# Patient Record
Sex: Male | Born: 1950 | Race: White | Hispanic: No | Marital: Single | Smoking: Current every day smoker
Health system: Southern US, Community
[De-identification: ages and names within clinical notes are randomized; demographics above are authoritative.]

## PROBLEM LIST (undated history)

## (undated) DIAGNOSIS — F0390 Unspecified dementia without behavioral disturbance: Secondary | ICD-10-CM

## (undated) DIAGNOSIS — I10 Essential (primary) hypertension: Secondary | ICD-10-CM

## (undated) DIAGNOSIS — I639 Cerebral infarction, unspecified: Secondary | ICD-10-CM

---

## 2008-04-08 ENCOUNTER — Ambulatory Visit: Payer: Self-pay | Admitting: Cardiology

## 2008-04-08 ENCOUNTER — Inpatient Hospital Stay (HOSPITAL_COMMUNITY): Admission: EM | Admit: 2008-04-08 | Discharge: 2008-04-10 | Payer: Self-pay | Admitting: Emergency Medicine

## 2008-04-09 ENCOUNTER — Ambulatory Visit: Payer: Self-pay | Admitting: Physical Medicine & Rehabilitation

## 2008-04-09 ENCOUNTER — Encounter (INDEPENDENT_AMBULATORY_CARE_PROVIDER_SITE_OTHER): Payer: Self-pay | Admitting: *Deleted

## 2008-04-09 ENCOUNTER — Ambulatory Visit: Payer: Self-pay | Admitting: Vascular Surgery

## 2011-03-07 NOTE — H&P (Signed)
NAME:  Mark [name], Mark [name] NO.:  [id number]   MEDICAL RECORD NO.:  [id number]          PATIENT TYPE:  EMS   LOCATION:  MAJO                         FACILITY:  [facility]   PHYSICIAN:  Michaelyn Barter, M.D. DATE OF BIRTH:  08/01/1951   DATE OF ADMISSION:  04/08/2008  DATE OF DISCHARGE:                              HISTORY & PHYSICAL   PRIMARY MEDICAL DOCTOR:  Unassigned.   CHIEF COMPLAINT:  Bilateral leg weakness.   HISTORY OF PRESENT ILLNESS:  Mr. Mark [name] is a 60 year old gentleman who  states that this morning at approximately 6 o'clock a.m. he experienced  weakness in both of his legs.  He is very vague with regards to giving  detail and is somewhat of a poor historian.  He indicates that his  roommate Mark [name] noticed that his speech appeared to be somewhat slurred and  also that he had some weakness in his right hand.  He indicated that it  was more difficult for him to hold a cup of coffee this morning than  typical.  He denies having any nausea, vomiting, fevers or chills.  No  chest pain or shortness of breath.   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   PAST SURGICAL HISTORY:  None.   ALLERGIES:  No known drug allergies.   SOCIAL HISTORY:  Cigarettes:  The patient smokes 1 pack of cigarettes  per day.  He  started at the age of 69.  Alcohol, beer:  The patient  drinks 6 to 10 cans of beer every day.  He has been doing so since the  age of 31.  He is divorced.  He has 1 daughter.   FAMILY HISTORY:  Mother had a history of Alzheimer's dementia.  Father's  medical history is unknown.   REVIEW OF SYSTEMS:  As per HPI; otherwise, all other systems are  negative.   PHYSICAL EXAMINATION:  GENERAL:  The patient is awake.  He is  cooperative.  He is in no obvious respiratory distress.  VITAL SIGNS:  Temperature 97.4, blood pressure 233/132, heart rate 110,  respirations 18, O2 sat 98%.  HEENT:  Normocephalic, atraumatic.  Anicteric.   Extraocular movements  are intact.  Pupils are equally reactive to light.  Oral mucosa is pink.  No thrush or exudates.  All of the patient's teeth are missing.  NECK:  Supple.  No JVD, no lymphadenopathy, no thyromegaly.  CARDIAC:  S1 and S2 present.  Regular rate and rhythm.  RESPIRATORY:  No crackles or wheezes.  ABDOMEN:  Flat, soft, nontender, nondistended.  No masses palpated.  Positive bowel sounds x4 quadrants.  EXTREMITIES:  No leg edema.  NEUROLOGIC:  The patient is alert and oriented x3.  MUSCULOSKELETAL:  Approximately 5/5 right arm, 4.5/5 left arm.  The left  arm has a significant decreased range of motion in comparison to the  right arm.  Bilateral leg strength is approximately 5/5.  Neurologically, the patient has a left-sided facial droop.  His grip  strength is greater on the  right than on the left.  Likewise, he has  somewhat of a pronator drift  on the left, and again he had significant  decreased range of motion on the left in comparison to the right.   A CT scan of the patient's head was completed.  It reveals extensive age-  advanced atrophy, chronic ischemic white matter disease, and innumerable  scattered lacunar infarcts. remote bilateral basal ganglia and deep  white matter infarcts, encephalomalacia from old right parietal infarct.  A chest x-ray reveals an ill-defined density at the left base medially.  No other significant findings were noted.  An EKG reveals normal sinus  rhythm with left ventricular hypertrophy.   ASSESSMENT/PLAN:  1. Bilateral leg and right arm weakness.  The patient's symptoms are      consistent with that of a cerebrovascular accident, the acuity of      which is questionable.  He is a very poor historian, indicating      that he did not notice some of his symptoms but in fact they were      brought to his attention by his roommate.  He indicated that he did      not at first notice that he was having any problem until 6 o'clock       this morning, and now it is going on 7 o'clock p.m.; therefore, if      he were a candidate for thrombolytic therapy, he is now out of the      window for that.  We will order an MRI, MRA of the patient's brain.      We will order a 2-D echo, carotid Dopplers, fasting lipid profile,      TSH, T4, and also consult neurology as well as physical therapy and      occupational therapy.  2. Severe uncontrolled hypertension.  We will start the patient on      antihypertensive medication.  3. History of alcohol abuse.  Currently he demonstrates no obvious      signs of withdrawal.  We will start him on thiamine, folic acid,      multivitamin and reserve Ativan for p.r.n. medication.  4. GI prophylaxis.  We will provide Protonix.  5. DVT prophylaxis with Lovenox.      Michaelyn Barter, M.D.  Electronically Signed     OR/MEDQ  D:  04/08/2008  T:  04/08/2008  Job:  [id number]

## 2011-03-07 NOTE — Discharge Summary (Signed)
NAME:  Mark [name], Mark [name]            ACCOUNT NO.:  [id number]   MEDICAL RECORD NO.:  [id number]          PATIENT TYPE:  INP   LOCATION:  3016                         FACILITY:  [facility]   PHYSICIAN:  Michaelyn Barter, M.D. DATE OF BIRTH:  [birth year]   DATE OF ADMISSION:  04/08/2008  DATE OF DISCHARGE:  04/10/2008                               DISCHARGE SUMMARY   FINAL DIAGNOSIS:  1. Acute cerebrovascular accident.  2. Hypokalemia.  3. Uncontrolled hypertension.  4. Alcohol abuse.   PROCEDURES:  1. CT scan of the head without contrast April 08, 2008.  2. Portable chest x-ray April 08, 2008.  3. MRI/MRA of the brain April 09, 2008.  4. MRA of the neck with and without contrast April 09, 2008.   CONSULTATIONS:  Neurology with Dr. Pearlean Brownie.   HISTORY OF PRESENT ILLNESS:  Mark [name] is a 60 year old gentleman who  indicated that, at approximately 6 a.m. on the morning of admission he  experienced weakness in both of his legs.  He was very vague with  regards to giving details, further regarding his weakness.  He indicated  that his roommate Mark [name] noticed that his speech also appeared to be  slurred, and he stated that his right hand had become a bit more weak  than typical.   PAST MEDICAL HISTORY:  Please see that dictated by Dr. Michaelyn Barter   HOSPITAL COURSE:  1. Acute CVA.  A CT scan of the patient's head without contrast was      done on June 17th.  It revealed extensive age advanced atrophy,      chronic ischemic white matter disease, and innumerable scattered      lacunar infarcts.  A remote bilateral basal ganglia and deep white      matter infarct was also noted.  Encephalomalacia from old right      parietal infarcts were also seen.  On June 18 the patient had a MRI      completed which revealed an acute 1 cm infarction in the right      corona radiata involving the superior/posterior limb of the      internal capsule.  His MRA revealed no major vessel occlusion,      diffuse  intracranial atherosclerotic change in the medium-to-small      vessels most notable in the MCA and PCA branches, mild stenosis of      the A1 and M1 segments on the left was also noted.   Neurology was consulted, Dr. Anne Hahn saw the patient.  His impression was  that the patient had a new onset stroke in the right centrum semi ovale  as well as severe small vessel disease.  Aspirin therapy was initiated.  Physical therapy was consulted as well as occupational therapy.  A 2-D  echocardiogram was completed on June 18.  Overall left ventricular  systolic function was normal.  Left ventricular EF was estimated to  range between 60%-65%.  No left ventricular regional wall motion  abnormalities were noted.  No obvious emboli was documented.  Carotid  duplex was completed on June 18th.  No significant plaque was  visualized.  No ICA stenosis was noted.  Rehab was consulted, and on  June 19th they documented that the patient will not need acute inpatient  rehab.  On June 19th occupational therapy saw the patient, and they  stated the patient was approaching baseline; and he stated that he felt  almost like his regular self.  Over the course of his hospitalization  his strength did begin to return, and he was able to ambulate around the  nurse's station with little assistance.   1. Severely uncontrolled hypertension.  The patient's blood pressure      was 233/132 at the time of admission.  He was started on a      combination of Norvasc and metoprolol.  During this hospital course      his blood pressure improved, although it did not completely      normalize.  He was told to followup with primary care physician for      further adjustments in his medications.   1. Hypokalemia.  Potassium supplementation was provided to the      patient.  2. History of alcohol abuse.  Thiamine, folic acid, and multivitamin      were provided.  The patient did not demonstrate any obvious active      signs of  withdrawal during his hospital course.  In addition, the      patient also had an MRA of his neck with and without contrast      completed on June 18th.  It revealed no significant carotid artery      disease, occluded right vertebral artery disease.  The distal right      vertebral artery did fill with contrast to the level of the PICA.   On the date of discharge the patient stated that he felt better and  stronger, and he requested to be discharged.  Vital Signs: Temperature  was 97.7, heart rate 67, respirations 18, blood pressure 169/88, O2 97  on room air.  His sodium was 137, potassium 4.0, chloride 106, CO2 24,  BUN 9, creatinine 0.64, glucose 109, calcium 9.1. The patient was  discharged from the hospital to home.   MEDICATIONS:  1. Norvasc 10 mg p.o. daily.  2. Metoprolol 25 mg p.o. b.i.d.  3. Ecotrin 325 mg p.o. daily.  4. Folic acid 1 mg daily.  5. Thiamine 1 mg p.o. daily.   He was told to take all of medications as prescribed, to stop drinking  alcohol, and to followup with Dr. Pearlean Brownie within 2 months.  He was given  Dr. Marlis Edelson number [phone] to call for an appointment.      Michaelyn Barter, M.D.  Electronically Signed     OR/MEDQ  D:  04/10/2008  T:  04/10/2008  Job:  [id number]

## 2011-03-07 NOTE — Consult Note (Signed)
NAME:  Mark [name], Mark [name]            ACCOUNT NO.:  [id number]   MEDICAL RECORD NO.:  [id number]          PATIENT TYPE:  INP   LOCATION:  3016                         FACILITY:  [facility]   PHYSICIAN:  Marlan Palau, M.D.  DATE OF BIRTH:  11/20/1950   DATE OF CONSULTATION:  04/08/2008  DATE OF DISCHARGE:                                 CONSULTATION   HISTORY OF PRESENT ILLNESS:  Mark [name] is a 60 year old right-  handed white male born Sep 30, 1951, with a history of tobacco  abuse, and alcohol over use.  This patient comes to [facility]  after awakened on April 08, 2008, with the difficulty with walking noted  that he was weak on the left arm and left leg.  The patient would tend  to fall.  She tried to walk down stairs.  The patient was brought in for  further evaluation.  An MRI scan has shown an acute right centrum  semiovale infarct.  The patient has very extensive small-vessel ischemic  changes, cerebral atrophy, right posterior parietal temporal infarct  noted that is chronic by MRI.  Intracranial MRI angiogram has been done  is relatively unremarkable and carotid Doppler study has been done which  is unremarkable.  Neurology is asked to see the patient for further  evaluation.  The patient was not being treated for blood pressure  problems and was not on aspirin prior to admission.   PAST MEDICAL HISTORY:  Significant for.  1. New onset of right centrum semiovale infarct.  2. Severe cerebrovascular disease by MRI.  3. Hypertension that was untreated.  4. Tobacco abuse.  5. 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   The patient had chronic diarrhea prior to treatment.  The patient smokes  a pack of cigarettes daily.  Drinks a 6-pack of beer daily, has no known  allergies, was not on any medications prior to coming in the hospital.   CURRENT MEDICATIONS:  Include.  1. Norvasc 5 mg daily.  2. Aspirin 325 mg daily.  3.  Lovenox 40 mg subcu daily.  4. Folic acid 1 mg daily.  5. Lopressor 25 mg b.i.d.  6. Multivitamins 1 a day.  7. Protonix 40 mg daily.  8. Potassium 40 mEq 1 daily.  9. Thiamine 100 mg daily.  10.Tylenol if needed   SOCIAL HISTORY:  The patient is single, lives in the [place] area, is recently been laid off, lives with a friend, has  1 daughter who is alive and well.   FAMILY MEDICAL HISTORY:  Mother died with intracranial hemorrhage.  Father had alcoholism.  Cause of death unknown.  The patient has 1  sister who is alive and well.  There is history of senile dementia  Alzheimer's type in the family in the mother.   REVIEW OF SYSTEMS:  Notable for no recent fevers, chills.  The patient  denies headache.  Denies visual field changes, swallowing problems,  speech changes, neck pain, shortness of breath.  Denies chest pains,  abdominal pains, nausea, vomiting, troubles controlling  bowels or  bladder, denies any previous numbness or weakness on the face, arms or  legs, headache, blackout episodes.   PHYSICAL EXAMINATION:  VITALS:  Blood pressure is 159/86, heart rate 77,  respiratory rate 18, temperature afebrile.  In general, this patient is  a fairly well-developed white male who is alert, cooperative at the time  examination.  HEENT EXAMINATION:  Head is atraumatic.  Eyes, pupils are equal, round  and to light.  Disks are flat bilaterally.  NECK:  Supple.  No carotid bruits noted.  RESPIRATORY EXAMINATION:  Clear.  CARDIOVASCULAR EXAMINATION:  Reveals a regular rate and rhythm.  No  obvious murmurs, rubs noted.  EXTREMITIES:  Without significant edema.  NEUROLOGIC EXAMINATION:  Cranial nerves as above.  Facial symmetry is  present.  The patient has good sensation of the face to pinprick, soft  touch bilaterally, has good strength, facial muscles and muscles head  turn shrug bilaterally.  Speech is well enunciated not aphasic.  Motor  testing reveals some  slight weakness of grip strength on the left arm,  otherwise to direct testing fairly good strength on both arms and both  legs; however, there is drift in the left arm and left leg.  No drift is  seen on the right.  Ataxia however seen in the upper extremity on the  right to finger-nose-finger, mild ataxia seen in both legs with heel-to-  shin.  The patient has no evidence of extinction, has symmetric  reflexes.  Toes are neutral bilaterally.  Again pinprick, soft touch,  sensation in the arms or legs is symmetric and normal.   LABORATORY VALUES:  Notable for white count of 11.5, hemoglobin 13.9,  hematocrit 39.5, MCV of 94.4, platelets of 312, INR of 0.9, sodium 134,  potassium 3.4, chloride of 102, CO2 of 23, glucose of 110, BUN of 7,  creatinine 0.63, total bili 1.1, alk phosphatase 45, SGOT 21, SGPT of  12, total protein 6.9, albumin of 4.0, calcium 9.2, CK-MB of 1.6,  troponin-I of less than 0.05, total cholesterol 171, triglycerides of  93, HDL 48, LDL 104, VLDL of 19.  Urine drug screen is negative with  exception of THC which is positive.  Urinalysis reveals specific gravity  of 1.009, pH 7.5, ketones of 15 mg/dL, protein 30 mg/dL, 0-2 white  cells.   MRI scan of the brain is as above.   Chest x-ray shows ill-defined density of left base.  No other acute  findings were noted.   IMPRESSION:  1. New onset of stroke in the right centrum semiovale.  2. Severe small vessel disease by the MRI associated with cortical      atrophy.  3. Hypertension.  4. Tobacco abuse and alcohol abuse.   This patient has NIH stroke scale score of 4, is not a t-PA candidate  due to duration of symptoms at the time of presentation.  The patient  has come in without prior medical follow-up, has likely had untreated  hypertension was not on antiplatelet agents prior to coming in, smoking  a pack of cigarettes daily.  The patient will complete further stroke  workup at this point.   PLAN:  1. MRI  angiogram of the extracranial vessels.  2. 2-D echocardiogram has been ordered, but is pending.  3. Blood pressure control.  4. Agree with physical and occupational therapy evaluation and rehab      evaluation.  5. Agree with aspirin treatment.  We will follow the patient's  clinical course while in-house.      Marlan Palau, M.D.  Electronically Signed     CKW/MEDQ  D:  04/09/2008  T:  04/10/2008  Job:  [id number]   cc:   [facility]

## 2011-07-20 LAB — T4: T4, Total: 6.8

## 2011-07-20 LAB — COMPREHENSIVE METABOLIC PANEL
Alkaline Phosphatase: 45
BUN: 7
CO2: 23
Chloride: 102
Creatinine, Ser: 0.63
GFR calc non Af Amer: >60
Potassium: 3.4 — ABNORMAL LOW
Total Bilirubin: 1.1

## 2011-07-20 LAB — URINALYSIS, ROUTINE W REFLEX MICROSCOPIC
Bilirubin Urine: NEGATIVE
Hgb urine dipstick: NEGATIVE
Ketones, ur: 15 — AB
Nitrite: NEGATIVE
pH: 7.5

## 2011-07-20 LAB — CBC
HCT: 39.5
HCT: 46.7
Hemoglobin: 13.9
MCV: 94.4
Platelets: 376
RBC: 4.19 — ABNORMAL LOW
RDW: 12.7
WBC: 11.5 — ABNORMAL HIGH

## 2011-07-20 LAB — HEPATIC FUNCTION PANEL
ALT: 16
AST: 22
Albumin: 4.6
Alkaline Phosphatase: 53
Total Bilirubin: 0.9

## 2011-07-20 LAB — BASIC METABOLIC PANEL
BUN: 4 — ABNORMAL LOW
CO2: 24
Calcium: 9.1
Calcium: 9.6
Chloride: 106
Creatinine, Ser: 0.64
GFR calc Af Amer: >60
GFR calc non Af Amer: >60
Glucose, Bld: 104 — ABNORMAL HIGH
Sodium: 137

## 2011-07-20 LAB — URINE MICROSCOPIC-ADD ON

## 2011-07-20 LAB — RAPID URINE DRUG SCREEN, HOSP PERFORMED
Amphetamines: NOT DETECTED
Barbiturates: NOT DETECTED
Opiates: NOT DETECTED

## 2011-07-20 LAB — LIPID PANEL
HDL: 48
VLDL: 19

## 2011-07-20 LAB — TSH: TSH: 1.458

## 2011-07-20 LAB — POCT CARDIAC MARKERS: Troponin i, poc: <0.05

## 2011-07-20 LAB — APTT: aPTT: 24

## 2014-09-08 ENCOUNTER — Inpatient Hospital Stay (HOSPITAL_COMMUNITY)
Admission: EM | Admit: 2014-09-08 | Discharge: 2014-09-21 | DRG: 350 | Disposition: A | Discharge status: Hospice | Payer: Medicare Other | Attending: Internal Medicine | Admitting: Internal Medicine

## 2014-09-08 ENCOUNTER — Emergency Department (HOSPITAL_COMMUNITY): Payer: Medicare Other

## 2014-09-08 ENCOUNTER — Encounter (HOSPITAL_COMMUNITY): Payer: Self-pay

## 2014-09-08 DIAGNOSIS — I611 Nontraumatic intracerebral hemorrhage in hemisphere, cortical: Secondary | ICD-10-CM | POA: Diagnosis not present

## 2014-09-08 DIAGNOSIS — W06XXXA Fall from bed, initial encounter: Secondary | ICD-10-CM | POA: Diagnosis not present

## 2014-09-08 DIAGNOSIS — Z79899 Other long term (current) drug therapy: Secondary | ICD-10-CM | POA: Diagnosis not present

## 2014-09-08 DIAGNOSIS — E871 Hypo-osmolality and hyponatremia: Secondary | ICD-10-CM | POA: Diagnosis present

## 2014-09-08 DIAGNOSIS — F1020 Alcohol dependence, uncomplicated: Secondary | ICD-10-CM | POA: Diagnosis present

## 2014-09-08 DIAGNOSIS — R532 Functional quadriplegia: Secondary | ICD-10-CM | POA: Diagnosis present

## 2014-09-08 DIAGNOSIS — G9340 Encephalopathy, unspecified: Secondary | ICD-10-CM | POA: Diagnosis present

## 2014-09-08 DIAGNOSIS — I619 Nontraumatic intracerebral hemorrhage, unspecified: Secondary | ICD-10-CM

## 2014-09-08 DIAGNOSIS — Z8673 Personal history of transient ischemic attack (TIA), and cerebral infarction without residual deficits: Secondary | ICD-10-CM

## 2014-09-08 DIAGNOSIS — F419 Anxiety disorder, unspecified: Secondary | ICD-10-CM | POA: Diagnosis present

## 2014-09-08 DIAGNOSIS — R531 Weakness: Secondary | ICD-10-CM | POA: Diagnosis present

## 2014-09-08 DIAGNOSIS — Z9181 History of falling: Secondary | ICD-10-CM

## 2014-09-08 DIAGNOSIS — R609 Edema, unspecified: Secondary | ICD-10-CM | POA: Diagnosis present

## 2014-09-08 DIAGNOSIS — D72829 Elevated white blood cell count, unspecified: Secondary | ICD-10-CM | POA: Diagnosis present

## 2014-09-08 DIAGNOSIS — F0390 Unspecified dementia without behavioral disturbance: Secondary | ICD-10-CM | POA: Diagnosis present

## 2014-09-08 DIAGNOSIS — K4030 Unilateral inguinal hernia, with obstruction, without gangrene, not specified as recurrent: Principal | ICD-10-CM | POA: Diagnosis present

## 2014-09-08 DIAGNOSIS — S065X0A Traumatic subdural hemorrhage without loss of consciousness, initial encounter: Secondary | ICD-10-CM | POA: Diagnosis not present

## 2014-09-08 DIAGNOSIS — Z515 Encounter for palliative care: Secondary | ICD-10-CM | POA: Diagnosis not present

## 2014-09-08 DIAGNOSIS — E876 Hypokalemia: Secondary | ICD-10-CM | POA: Diagnosis not present

## 2014-09-08 DIAGNOSIS — F17210 Nicotine dependence, cigarettes, uncomplicated: Secondary | ICD-10-CM | POA: Diagnosis present

## 2014-09-08 DIAGNOSIS — I10 Essential (primary) hypertension: Secondary | ICD-10-CM | POA: Diagnosis present

## 2014-09-08 DIAGNOSIS — R296 Repeated falls: Secondary | ICD-10-CM | POA: Diagnosis present

## 2014-09-08 DIAGNOSIS — S0633AA Contusion and laceration of cerebrum, unspecified, with loss of consciousness status unknown, initial encounter: Secondary | ICD-10-CM | POA: Insufficient documentation

## 2014-09-08 DIAGNOSIS — R627 Adult failure to thrive: Secondary | ICD-10-CM | POA: Diagnosis present

## 2014-09-08 DIAGNOSIS — S06360A Traumatic hemorrhage of cerebrum, unspecified, without loss of consciousness, initial encounter: Secondary | ICD-10-CM | POA: Insufficient documentation

## 2014-09-08 DIAGNOSIS — F0150 Vascular dementia without behavioral disturbance: Secondary | ICD-10-CM | POA: Diagnosis present

## 2014-09-08 DIAGNOSIS — Z681 Body mass index (BMI) 19 or less, adult: Secondary | ICD-10-CM

## 2014-09-08 DIAGNOSIS — R509 Fever, unspecified: Secondary | ICD-10-CM

## 2014-09-08 DIAGNOSIS — E43 Unspecified severe protein-calorie malnutrition: Secondary | ICD-10-CM | POA: Diagnosis present

## 2014-09-08 DIAGNOSIS — Z7189 Other specified counseling: Secondary | ICD-10-CM | POA: Insufficient documentation

## 2014-09-08 DIAGNOSIS — N390 Urinary tract infection, site not specified: Secondary | ICD-10-CM | POA: Diagnosis not present

## 2014-09-08 DIAGNOSIS — Y92230 Patient room in hospital as the place of occurrence of the external cause: Secondary | ICD-10-CM

## 2014-09-08 DIAGNOSIS — Z66 Do not resuscitate: Secondary | ICD-10-CM | POA: Diagnosis present

## 2014-09-08 DIAGNOSIS — R451 Restlessness and agitation: Secondary | ICD-10-CM | POA: Insufficient documentation

## 2014-09-08 DIAGNOSIS — S065X9A Traumatic subdural hemorrhage with loss of consciousness of unspecified duration, initial encounter: Secondary | ICD-10-CM

## 2014-09-08 DIAGNOSIS — E869 Volume depletion, unspecified: Secondary | ICD-10-CM | POA: Diagnosis not present

## 2014-09-08 DIAGNOSIS — K4090 Unilateral inguinal hernia, without obstruction or gangrene, not specified as recurrent: Secondary | ICD-10-CM

## 2014-09-08 DIAGNOSIS — F1027 Alcohol dependence with alcohol-induced persisting dementia: Secondary | ICD-10-CM | POA: Diagnosis present

## 2014-09-08 DIAGNOSIS — S065XAA Traumatic subdural hemorrhage with loss of consciousness status unknown, initial encounter: Secondary | ICD-10-CM | POA: Diagnosis not present

## 2014-09-08 DIAGNOSIS — E222 Syndrome of inappropriate secretion of antidiuretic hormone: Secondary | ICD-10-CM | POA: Diagnosis present

## 2014-09-08 DIAGNOSIS — R0682 Tachypnea, not elsewhere classified: Secondary | ICD-10-CM

## 2014-09-08 DIAGNOSIS — I639 Cerebral infarction, unspecified: Secondary | ICD-10-CM | POA: Insufficient documentation

## 2014-09-08 DIAGNOSIS — W19XXXA Unspecified fall, initial encounter: Secondary | ICD-10-CM

## 2014-09-08 HISTORY — DX: Cerebral infarction, unspecified: I63.9

## 2014-09-08 HISTORY — DX: Essential (primary) hypertension: I10

## 2014-09-08 HISTORY — DX: Unspecified dementia, unspecified severity, without behavioral disturbance, psychotic disturbance, mood disturbance, and anxiety: F03.90

## 2014-09-08 LAB — PROTIME-INR
INR: 0.95 (ref 0.00–1.49)
Prothrombin Time: 12.8 seconds (ref 11.6–15.2)

## 2014-09-08 LAB — COMPREHENSIVE METABOLIC PANEL
ALBUMIN: 4 g/dL (ref 3.5–5.2)
ALT: 13 U/L (ref 0–53)
AST: 31 U/L (ref 0–37)
Alkaline Phosphatase: 63 U/L (ref 39–117)
Anion gap: 14 (ref 5–15)
BILIRUBIN TOTAL: 0.7 mg/dL (ref 0.3–1.2)
BUN: 6 mg/dL (ref 6–23)
CHLORIDE: 82 meq/L — AB (ref 96–112)
CO2: 21 mEq/L (ref 19–32)
CREATININE: 0.59 mg/dL (ref 0.50–1.35)
Calcium: 9.4 mg/dL (ref 8.4–10.5)
GFR calc Af Amer: >90 mL/min (ref >90)
GFR calc non Af Amer: >90 mL/min (ref >90)
Glucose, Bld: 94 mg/dL (ref 70–99)
POTASSIUM: 4.1 meq/L (ref 3.7–5.3)
Sodium: 117 mEq/L — CL (ref 137–147)
Total Protein: 7.5 g/dL (ref 6.0–8.3)

## 2014-09-08 LAB — CBC WITH DIFFERENTIAL/PLATELET
BASOS ABS: 0 10*3/uL (ref 0.0–0.1)
Basophils Relative: 0 % (ref 0–1)
EOS PCT: 0 % (ref 0–5)
Eosinophils Absolute: 0 10*3/uL (ref 0.0–0.7)
HEMATOCRIT: 38.6 % — AB (ref 39.0–52.0)
Hemoglobin: 13.9 g/dL (ref 13.0–17.0)
LYMPHS ABS: 3.2 10*3/uL (ref 0.7–4.0)
Lymphocytes Relative: 16 % (ref 12–46)
MCH: 30.5 pg (ref 26.0–34.0)
MCHC: 36 g/dL (ref 30.0–36.0)
MCV: 84.8 fL (ref 78.0–100.0)
MONOS PCT: 7 % (ref 3–12)
Monocytes Absolute: 1.4 10*3/uL — ABNORMAL HIGH (ref 0.1–1.0)
Neutro Abs: 15.2 10*3/uL — ABNORMAL HIGH (ref 1.7–7.7)
Neutrophils Relative %: 77 % (ref 43–77)
Platelets: 363 10*3/uL (ref 150–400)
RBC: 4.55 MIL/uL (ref 4.22–5.81)
RDW: 12.2 % (ref 11.5–15.5)
WBC: 19.8 10*3/uL — AB (ref 4.0–10.5)

## 2014-09-08 LAB — RAPID URINE DRUG SCREEN, HOSP PERFORMED
Amphetamines: NOT DETECTED
Barbiturates: NOT DETECTED
Benzodiazepines: NOT DETECTED
Cocaine: NOT DETECTED
Opiates: NOT DETECTED
TETRAHYDROCANNABINOL: POSITIVE — AB

## 2014-09-08 LAB — TROPONIN I

## 2014-09-08 LAB — I-STAT CG4 LACTIC ACID, ED: Lactic Acid, Venous: 1.28 mmol/L (ref 0.5–2.2)

## 2014-09-08 LAB — ETHANOL: Alcohol, Ethyl (B): <11 mg/dL (ref 0–11)

## 2014-09-08 MED ORDER — CEFAZOLIN (ANCEF) 1 G IV SOLR
2.0000 g | INTRAVENOUS | Status: AC
  Administered 2014-09-09: 2 g
  Filled 2014-09-08: qty 2

## 2014-09-08 MED ORDER — IOHEXOL 300 MG/ML  SOLN
100.0000 mL | Freq: Once | INTRAMUSCULAR | Status: AC | PRN
  Administered 2014-09-08: 100 mL via INTRAVENOUS

## 2014-09-08 MED ORDER — HYDROGEN PEROXIDE 3 % EX SOLN
CUTANEOUS | Status: AC
  Administered 2014-09-08: 1
  Filled 2014-09-08: qty 473

## 2014-09-08 MED ORDER — LORAZEPAM 2 MG/ML IJ SOLN
1.0000 mg | Freq: Once | INTRAMUSCULAR | Status: AC
  Administered 2014-09-08: 1 mg via INTRAVENOUS
  Filled 2014-09-08: qty 1

## 2014-09-08 MED ORDER — SODIUM CHLORIDE 0.9 % IV BOLUS (SEPSIS)
500.0000 mL | Freq: Once | INTRAVENOUS | Status: AC
  Administered 2014-09-08: 500 mL via INTRAVENOUS

## 2014-09-08 MED ORDER — LORAZEPAM 2 MG/ML IJ SOLN
INTRAMUSCULAR | Status: AC
  Administered 2014-09-08: 0.5 mg
  Filled 2014-09-08: qty 1

## 2014-09-08 MED ORDER — TUBERCULIN PPD 5 UNIT/0.1ML ID SOLN
5.0000 [IU] | Freq: Once | INTRADERMAL | Status: AC
  Administered 2014-09-08: 5 [IU] via INTRADERMAL
  Filled 2014-09-08: qty 0.1

## 2014-09-08 NOTE — ED Notes (Signed)
Patient family at bedside, states patient has right side weakness due to CVA in 2009 Patient noted to have residual tremors to both arms, which patient's family states in normal for patient Patient states that he does not know why he is here in ED Family states that patient has been falling more than usual

## 2014-09-08 NOTE — ED Notes (Signed)
Verbal consent from sister Darel [place], [place].

## 2014-09-08 NOTE — H&P (Signed)
Mark [name] is an 63 y.o. male.    Chief Complaint: Family reports Falls, weakness  HPI: Patient is a 63 year old male.  He reportedly was brought to the emergency room by his family due to weakness and falling. During workup he was found to have an incarcerated right inguinal hernia and I was asked to see the patient for this.  There is currently no family in the hospital from whom to obtain a history.. The patient is unable to give a history.  Past Medical History  Diagnosis Date  . Hypertension   . Stroke   . Dementia     History reviewed. No pertinent past surgical history.  History reviewed. No pertinent family history. Social History:  reports that he has been smoking.  He does not have any smokeless tobacco history on file. He reports that he drinks alcohol. He reports that he does not use illicit drugs.  Allergies: No Known Allergies   Current Facility-Administered Medications  Medication Dose Route Frequency Provider Last Rate Last Dose  . tuberculin injection 5 Units  5 Units Intradermal Once Pamella Pert, MD   5 Units at 09/08/14 2202   Current Outpatient Prescriptions  Medication Sig Dispense Refill  . lisinopril (PRINIVIL,ZESTRIL) 20 MG tablet Take 20 mg by mouth daily.    Marland Kitchen lisinopril (PRINIVIL,ZESTRIL) 10 MG tablet Take 10 mg by mouth daily.       Results for orders placed or performed during the hospital encounter of 09/08/14 (from the past 48 hour(s))  Comprehensive metabolic panel     Status: Abnormal   Collection Time: 09/08/14  8:21 PM  Result Value Ref Range   Sodium 117 (LL) 137 - 147 mEq/L    Comment: CRITICAL RESULT CALLED TO, READ BACK BY AND VERIFIED WITH: Francesca Oman RN 2114 11/147/15 A NAVARRO    Potassium 4.1 3.7 - 5.3 mEq/L   Chloride 82 (L) 96 - 112 mEq/L   CO2 21 19 - 32 mEq/L   Glucose, Bld 94 70 - 99 mg/dL   BUN 6 6 - 23 mg/dL   Creatinine, Ser 0.59 0.50 - 1.35 mg/dL   Calcium 9.4 8.4 - 10.5 mg/dL   Total Protein 7.5 6.0 - 8.3  g/dL   Albumin 4.0 3.5 - 5.2 g/dL   AST 31 0 - 37 U/L   ALT 13 0 - 53 U/L   Alkaline Phosphatase 63 39 - 117 U/L   Total Bilirubin 0.7 0.3 - 1.2 mg/dL   GFR calc non Af Amer >90 >90 mL/min   GFR calc Af Amer >90 >90 mL/min    Comment: (NOTE) The eGFR has been calculated using the CKD EPI equation. This calculation has not been validated in all clinical situations. eGFR's persistently <90 mL/min signify possible Chronic Kidney Disease.    Anion gap 14 5 - 15  CBC with Differential     Status: Abnormal   Collection Time: 09/08/14  8:21 PM  Result Value Ref Range   WBC 19.8 (H) 4.0 - 10.5 K/uL   RBC 4.55 4.22 - 5.81 MIL/uL   Hemoglobin 13.9 13.0 - 17.0 g/dL   HCT 38.6 (L) 39.0 - 52.0 %   MCV 84.8 78.0 - 100.0 fL   MCH 30.5 26.0 - 34.0 pg   MCHC 36.0 30.0 - 36.0 g/dL   RDW 12.2 11.5 - 15.5 %   Platelets 363 150 - 400 K/uL   Neutrophils Relative % 77 43 - 77 %   Lymphocytes Relative 16 12 - 46 %  Monocytes Relative 7 3 - 12 %   Eosinophils Relative 0 0 - 5 %   Basophils Relative 0 0 - 1 %   Neutro Abs 15.2 (H) 1.7 - 7.7 K/uL   Lymphs Abs 3.2 0.7 - 4.0 K/uL   Monocytes Absolute 1.4 (H) 0.1 - 1.0 K/uL   Eosinophils Absolute 0.0 0.0 - 0.7 K/uL   Basophils Absolute 0.0 0.0 - 0.1 K/uL   Smear Review MORPHOLOGY UNREMARKABLE   Ethanol     Status: Mark [name]   Collection Time: 09/08/14  8:21 PM  Result Value Ref Range   Alcohol, Ethyl (B) <11 0 - 11 mg/dL    Comment:        LOWEST DETECTABLE LIMIT FOR SERUM ALCOHOL IS 11 mg/dL FOR MEDICAL PURPOSES ONLY   Protime-INR     Status: Mark [name]   Collection Time: 09/08/14  8:21 PM  Result Value Ref Range   Prothrombin Time 12.8 11.6 - 15.2 seconds   INR 0.95 0.00 - 1.49  Troponin I     Status: Mark [name]   Collection Time: 09/08/14  8:21 PM  Result Value Ref Range   Troponin I <0.30 <0.30 ng/mL    Comment:        Due to the release kinetics of cTnI, a negative result within the first hours of the onset of symptoms does not rule out myocardial  infarction with certainty. If myocardial infarction is still suspected, repeat the test at appropriate intervals.   I-Stat CG4 Lactic Acid, ED     Status: Mark [name]   Collection Time: 09/08/14  9:08 PM  Result Value Ref Range   Lactic Acid, Venous 1.28 0.5 - 2.2 mmol/L   Dg Chest 2 View  09/08/2014   CLINICAL DATA:  WEAKNESS, confusion  EXAM: CHEST - 2 VIEW  COMPARISON:  04/08/2008  FINDINGS: Calcified lingular nodules and left hilar lymph nodes, stable. Lungs are otherwise clear. Heart size and mediastinal contours are within normal limits. No effusion. Visualized skeletal structures are unremarkable.  IMPRESSION: 1. No acute cardiopulmonary disease. 2. Old granulomatous disease.   Electronically Signed   By: Arne Cleveland M.D.   On: 09/08/2014 20:25   Ct Head Wo Contrast  09/08/2014   CLINICAL DATA:  Tremors, frequent falls, altered mental status, history hypertension, stroke, dementia, smoking  EXAM: CT HEAD WITHOUT CONTRAST  TECHNIQUE: Contiguous axial images were obtained from the base of the skull through the vertex without intravenous contrast. Motion artifacts on initial images, for which repeat imaging was performed.  COMPARISON:  04/08/2008  FINDINGS: Generalized atrophy.  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  Old small BILATERAL occipital infarcts as well.  No midline shift or mass effect.  Multiple old lacunar infarcts BILATERAL basal ganglia and RIGHT thalamus.  Probable small old infarct RIGHT cerebellar hemisphere.  Extensive small vessel chronic ischemic changes of deep cerebral white matter.  No intracranial hemorrhage, mass lesion, or evidence acute infarction.  No extra-axial fluid collections.  Bones demineralized.  Visualized sinuses and mastoid air cells clear.  Atherosclerotic calcifications at skullbase.  IMPRESSION: Atrophy with small vessel chronic ischemic changes of deep cerebral  white matter.  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  No definite acute intracranial abnormalities.   Electronically Signed   By: Lavonia Dana M.D.   On: 09/08/2014 22:06   Ct Abdomen Pelvis W Contrast  09/08/2014   CLINICAL DATA:  RIGHT inguinal hernia, bursal  history of hypertension, stroke, dementia, smoking  EXAM: CT ABDOMEN AND PELVIS WITH CONTRAST  TECHNIQUE: Multidetector CT imaging of the abdomen and pelvis was performed using the standard protocol following bolus administration of intravenous contrast. Sagittal and coronal MPR images reconstructed from axial data set. Examination degraded by patient motion.  CONTRAST:  158mL OMNIPAQUE IOHEXOL 300 MG/ML SOLN IV. No oral contrast administered.  COMPARISON:  Mark [name]  FINDINGS: Minimal bibasilar atelectasis at motion artifacts.  Extensive atherosclerotic calcifications aorta, celiac artery, SMA, RIGHT renal artery and coronary arteries.  Within limitations of motion, liver, spleen, pancreas, kidneys, and adrenal glands normal.  RIGHT inguinal hernia containing small bowel loops.  Dilatation of a few intra-abdominal small bowel loops is identified compatible with small bowel obstruction at the hernia.  Distal small bowel loops and colon appear decompressed.  Minimal bowel wall thickening and hyperemia of small bowel loops proximal to the hernia suggesting incarceration and edema.  No free intraperitoneal air identified within motion limitations.  Distended bladder.  No mass, adenopathy, or free fluid.  Bones demineralized.  IMPRESSION: RIGHT inguinal hernia containing small bowel loops.  Associated dilatation of intra-abdominal small bowel loops compatible with small bowel obstruction secondary to RIGHT inguinal hernia, with some of a small bowel loops proximal to the hernia demonstrating hyperemia and minimal wall thickening suggesting incarceration.  No gross evidence of perforation or abscess on exam limited by patient motion.  Extensive  atherosclerotic disease.   Electronically Signed   By: Lavonia Dana M.D.   On: 09/08/2014 22:14    Review of Systems  Unable to perform ROS   Blood pressure 121/64, pulse 92, temperature 97.8 F (36.6 C), temperature source Oral, resp. rate 14, SpO2 100 %. Physical Exam  General: Chronically ill-appearing thin Caucasian male, no acute distress,  will follow a few simple commands and a few single word responses Skin: Warm and dry without rash or infection. HEENT: No palpable masses or thyromegaly. Sclera nonicteric.  Lymph nodes: No cervical, supraclavicular, or inguinal nodes palpable. Lungs: Breath sounds clear and equal without increased work of breathing Cardiovascular: Regular rate and rhythm without murmur. No JVD or edema. Peripheral pulses intact. Abdomen: Thin, mildly distended. Generally nontender. No masses palpable. No organomegaly.There is a fairly large incarcerated hernia mass in the right groin which is tender and not reducible. Extremities: No edema or joint swelling or deformity. No chronic venous stasis changes. Abrasion over left knee. Neurologic: Responds minimally to voice with Grunts or one-word answers. Moves extremities 4 purposefully to pain.  Assessment/Plan Chronically ill 63 year old man with history of alcoholism, CVAs with dementia and hypertension. He presents with apparent weakness and falls. I cannot obtain a history. He has  hyponatremia Which could be secondary to vomiting. He has a tender incarcerated right inguinal hernia with evidence of small bowel obstruction and elevated white count with possible strangulation/bowel ischemia. Although he is at high risk he will require emergency repair. I am attempting to contact family to discuss surgery.  Kaysi Ourada T 09/08/2014, 11:22 PM

## 2014-09-08 NOTE — ED Notes (Signed)
Per family, lives in apartment with roommate. He has fallen more frequently than normal.  Extremely weak with difficulty walking.  Per family, no unilateral changes in his face or changes in speech.  Pt has gestured movements and does this at baseline.

## 2014-09-08 NOTE — ED Notes (Signed)
2 Unsuccessful In and Out cath attempts.

## 2014-09-08 NOTE — ED Provider Notes (Signed)
CSN: [id number]     Arrival date & time 09/08/14  1811 History   First MD Initiated Contact with Patient 09/08/14 1951     Chief Complaint  Patient presents with  . Weakness     (Consider location/radiation/quality/duration/timing/severity/associated sxs/prior Treatment) Patient is a 63 y.o. male presenting with neurologic complaint. History provided by: family.  Neurologic Problem This is a new problem. Episode onset: 2 weeks ago. The problem occurs constantly. The problem has been gradually worsening. Pertinent negatives include no chest pain, no abdominal pain, no headaches and no shortness of breath. Nothing aggravates the symptoms. Nothing relieves the symptoms. He has tried nothing for the symptoms. The treatment provided no relief.    Past Medical History  Diagnosis Date  . Hypertension   . Stroke   . Dementia    History reviewed. No pertinent past surgical history. History reviewed. No pertinent family history. History  Substance Use Topics  . Smoking status: Current Every Day Smoker  . Smokeless tobacco: Not on file  . Alcohol Use: Yes     Comment: occasional    Review of Systems  Constitutional: Negative for fever.  HENT: Negative for drooling and rhinorrhea.   Eyes: Negative for pain.  Respiratory: Negative for cough and shortness of breath.   Cardiovascular: Negative for chest pain and leg swelling.  Gastrointestinal: Negative for nausea, vomiting, abdominal pain and diarrhea.  Genitourinary: Negative for dysuria and hematuria.  Musculoskeletal: Negative for gait problem and neck pain.  Skin: Negative for color change.  Neurological: Positive for weakness. Negative for numbness and headaches.       Falls  Hematological: Negative for adenopathy.  Psychiatric/Behavioral: Negative for behavioral problems.  All other systems reviewed and are negative.     Allergies  Review of patient's allergies indicates no known allergies.  Home Medications   Prior to  Admission medications   Medication Sig Start Date End Date Taking? Authorizing Provider  lisinopril (PRINIVIL,ZESTRIL) 20 MG tablet Take 20 mg by mouth daily.   Yes Historical Provider, MD  lisinopril (PRINIVIL,ZESTRIL) 10 MG tablet Take 10 mg by mouth daily.    Historical Provider, MD   BP 191/115 mmHg  Pulse 104  Temp(Src) 97.8 F (36.6 C) (Oral)  Resp 18  SpO2 100% Physical Exam  Constitutional: He appears well-developed and well-nourished.  HENT:  Head: Normocephalic and atraumatic.  Right Ear: External ear normal.  Left Ear: External ear normal.  Nose: Nose normal.  Mouth/Throat: Oropharynx is clear and moist. No oropharyngeal exudate.  Eyes: Conjunctivae and EOM are normal. Pupils are equal, round, and reactive to light.  Neck: Normal range of motion. Neck supple.  Cardiovascular: Normal rate, regular rhythm, normal heart sounds and intact distal pulses.  Exam reveals no gallop and no friction rub.   No murmur heard. Pulmonary/Chest: Effort normal and breath sounds normal. No respiratory distress. He has no wheezes.  Abdominal: Soft. Bowel sounds are normal. He exhibits no distension. There is no tenderness. There is no rebound and no guarding.  Genitourinary:  Non-reducible right inguinal hernia.   Musculoskeletal: Normal range of motion. He exhibits no edema or tenderness.  Multiple superficial abrasions to the bilateral upper and lower extremities. No obvious focal tenderness to palpation of his extremities.  Neurological: He is alert.  Alert and oriented 1. He will not follow specific commands and is agitated on exam. He is moving all extremities.  The family states that he does not like to be touched and gets agitated very easily.  Skin: Skin is warm and dry.  Psychiatric: He has a normal mood and affect. His behavior is normal.  Nursing note and vitals reviewed.   ED Course  Procedures (including critical care time) Labs Review Labs Reviewed  URINE RAPID DRUG  SCREEN (HOSP PERFORMED) - Abnormal; Notable for the following:    Tetrahydrocannabinol POSITIVE (*)    All other components within normal limits  COMPREHENSIVE METABOLIC PANEL - Abnormal; Notable for the following:    Sodium 117 (*)    Chloride 82 (*)    All other components within normal limits  CBC WITH DIFFERENTIAL - Abnormal; Notable for the following:    WBC 19.8 (*)    HCT 38.6 (*)    Neutro Abs 15.2 (*)    Monocytes Absolute 1.4 (*)    All other components within normal limits  ETHANOL  PROTIME-INR  TROPONIN I  URINALYSIS, ROUTINE W REFLEX MICROSCOPIC  OSMOLALITY, URINE  SODIUM, URINE, RANDOM  AMMONIA  TSH  VITAMIN B1  BASIC METABOLIC PANEL  OSMOLALITY  CORTISOL  I-STAT CG4 LACTIC ACID, ED  TYPE AND SCREEN    Imaging Review Dg Chest 2 View  09/08/2014   CLINICAL DATA:  WEAKNESS, confusion  EXAM: CHEST - 2 VIEW  COMPARISON:  04/08/2008  FINDINGS: Calcified lingular nodules and left hilar lymph nodes, stable. Lungs are otherwise clear. Heart size and mediastinal contours are within normal limits. No effusion. Visualized skeletal structures are unremarkable.  IMPRESSION: 1. No acute cardiopulmonary disease. 2. Old granulomatous disease.   Electronically Signed   By: Oley Balmaniel  Hassell M.D.   On: 09/08/2014 20:25   Ct Head Wo Contrast  09/08/2014   CLINICAL DATA:  Tremors, frequent falls, altered mental status, history hypertension, stroke, dementia, smoking  EXAM: CT HEAD WITHOUT CONTRAST  TECHNIQUE: Contiguous axial images were obtained from the base of the skull through the vertex without intravenous contrast. Motion artifacts on initial images, for which repeat imaging was performed.  COMPARISON:  04/08/2008  FINDINGS: Generalized atrophy.  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  Old small BILATERAL occipital infarcts as well.  No midline shift or mass effect.  Multiple old lacunar  infarcts BILATERAL basal ganglia and RIGHT thalamus.  Probable small old infarct RIGHT cerebellar hemisphere.  Extensive small vessel chronic ischemic changes of deep cerebral white matter.  No intracranial hemorrhage, mass lesion, or evidence acute infarction.  No extra-axial fluid collections.  Bones demineralized.  Visualized sinuses and mastoid air cells clear.  Atherosclerotic calcifications at skullbase.  IMPRESSION: Atrophy with small vessel chronic ischemic changes of deep cerebral white matter.  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  No definite acute intracranial abnormalities.   Electronically Signed   By: Ulyses SouthwardMark  Boles M.D.   On: 09/08/2014 22:06   Ct Abdomen Pelvis W Contrast  09/08/2014   CLINICAL DATA:  RIGHT inguinal hernia, bursal history of hypertension, stroke, dementia, smoking  EXAM: CT ABDOMEN AND PELVIS WITH CONTRAST  TECHNIQUE: Multidetector CT imaging of the abdomen and pelvis was performed using the standard protocol following bolus administration of intravenous contrast. Sagittal and coronal MPR images reconstructed from axial data set. Examination degraded by patient motion.  CONTRAST:  100mL OMNIPAQUE IOHEXOL 300 MG/ML SOLN IV. No oral contrast administered.  COMPARISON:  None  FINDINGS: Minimal bibasilar atelectasis at motion artifacts.  Extensive atherosclerotic calcifications aorta, celiac artery, SMA, RIGHT renal artery and coronary arteries.  Within limitations of motion,  liver, spleen, pancreas, kidneys, and adrenal glands normal.  RIGHT inguinal hernia containing small bowel loops.  Dilatation of a few intra-abdominal small bowel loops is identified compatible with small bowel obstruction at the hernia.  Distal small bowel loops and colon appear decompressed.  Minimal bowel wall thickening and hyperemia of small bowel loops proximal to the hernia suggesting incarceration and edema.  No free intraperitoneal air identified within motion limitations.  Distended  bladder.  No mass, adenopathy, or free fluid.  Bones demineralized.  IMPRESSION: RIGHT inguinal hernia containing small bowel loops.  Associated dilatation of intra-abdominal small bowel loops compatible with small bowel obstruction secondary to RIGHT inguinal hernia, with some of a small bowel loops proximal to the hernia demonstrating hyperemia and minimal wall thickening suggesting incarceration.  No gross evidence of perforation or abscess on exam limited by patient motion.  Extensive atherosclerotic disease.   Electronically Signed   By: Ulyses Southward M.D.   On: 09/08/2014 22:14     EKG Interpretation   Date/Time:  Tuesday September 08 2014 20:52:53 EST Ventricular Rate:  93 PR Interval:  168 QRS Duration: 93 QT Interval:  367 QTC Calculation: 456 R Axis:   65 Text Interpretation:  Sinus rhythm Atrial premature complexes Biatrial  enlargement RSR' in V1 or V2, right VCD or RVH No significant change since  last tracing Confirmed by Arias Weinert  MD, Alma Muegge ([id number]) on 09/08/2014  9:56:21 PM      CRITICAL CARE Performed by: Purvis Sheffield, S Total critical care time: 30 min Critical care time was exclusive of separately billable procedures and treating other patients. Critical care was necessary to treat or prevent imminent or life-threatening deterioration. Critical care was time spent personally by me on the following activities: development of treatment plan with patient and/or surrogate as well as nursing, discussions with consultants, evaluation of patient's response to treatment, examination of patient, obtaining history from patient or surrogate, ordering and performing treatments and interventions, ordering and review of laboratory studies, ordering and review of radiographic studies, pulse oximetry and re-evaluation of patient's condition.  MDM   Final diagnoses:  Incarcerated right inguinal hernia  Hyponatremia  Generalized weakness  Recurrent falls    8:11 PM 64 y.o. male  w hx of CVA, HTN, dementia, etoh abuse who presents with recurrent falls over the last 2 weeks.his family states that he has had worsening generalized weakness and unsteadiness on his feet. He is afebrile and hypertensive here. They state that his mental status has been normal. We'll get screening labs and imaging.  10:52 PM Case discussed w/ Dr. Johna Sheriff who will see the pt.   Will admit to medicine. Pt taken to surgery for the incarcerated inguinal hernia.   Purvis Sheffield, MD 09/08/14 [phone]

## 2014-09-09 ENCOUNTER — Inpatient Hospital Stay: Admit: 2014-09-09 | Payer: Self-pay | Admitting: General Surgery

## 2014-09-09 ENCOUNTER — Encounter (HOSPITAL_COMMUNITY): Payer: Self-pay | Admitting: Internal Medicine

## 2014-09-09 ENCOUNTER — Inpatient Hospital Stay (HOSPITAL_COMMUNITY): Payer: Medicare Other | Admitting: Registered Nurse

## 2014-09-09 ENCOUNTER — Encounter (HOSPITAL_COMMUNITY)
Admission: EM | Disposition: A | Discharge status: Hospice | Payer: Self-pay | Source: Home / Self Care | Attending: Internal Medicine

## 2014-09-09 DIAGNOSIS — D72829 Elevated white blood cell count, unspecified: Secondary | ICD-10-CM | POA: Diagnosis present

## 2014-09-09 DIAGNOSIS — G9340 Encephalopathy, unspecified: Secondary | ICD-10-CM | POA: Diagnosis present

## 2014-09-09 DIAGNOSIS — E871 Hypo-osmolality and hyponatremia: Secondary | ICD-10-CM

## 2014-09-09 DIAGNOSIS — R296 Repeated falls: Secondary | ICD-10-CM

## 2014-09-09 DIAGNOSIS — K4030 Unilateral inguinal hernia, with obstruction, without gangrene, not specified as recurrent: Principal | ICD-10-CM

## 2014-09-09 DIAGNOSIS — I10 Essential (primary) hypertension: Secondary | ICD-10-CM | POA: Diagnosis present

## 2014-09-09 DIAGNOSIS — F1020 Alcohol dependence, uncomplicated: Secondary | ICD-10-CM | POA: Diagnosis present

## 2014-09-09 HISTORY — PX: INGUINAL HERNIA REPAIR: SHX194

## 2014-09-09 LAB — BASIC METABOLIC PANEL
ANION GAP: 12 (ref 5–15)
ANION GAP: 15 (ref 5–15)
Anion gap: 10 (ref 5–15)
Anion gap: 11 (ref 5–15)
Anion gap: 9 (ref 5–15)
Anion gap: 11 (ref 5–15)
BUN: 3 mg/dL — ABNORMAL LOW (ref 6–23)
BUN: 3 mg/dL — ABNORMAL LOW (ref 6–23)
BUN: 4 mg/dL — ABNORMAL LOW (ref 6–23)
BUN: 4 mg/dL — ABNORMAL LOW (ref 6–23)
BUN: 5 mg/dL — ABNORMAL LOW (ref 6–23)
BUN: 5 mg/dL — ABNORMAL LOW (ref 6–23)
CHLORIDE: 84 meq/L — AB (ref 96–112)
CHLORIDE: 89 meq/L — AB (ref 96–112)
CHLORIDE: 95 meq/L — AB (ref 96–112)
CO2: 20 mEq/L (ref 19–32)
CO2: 20 meq/L (ref 19–32)
CO2: 22 mEq/L (ref 19–32)
CO2: 22 mEq/L (ref 19–32)
CO2: 22 meq/L (ref 19–32)
CO2: 24 mEq/L (ref 19–32)
CREATININE: 0.64 mg/dL (ref 0.50–1.35)
CREATININE: 0.65 mg/dL (ref 0.50–1.35)
Calcium: 8.1 mg/dL — ABNORMAL LOW (ref 8.4–10.5)
Calcium: 8.2 mg/dL — ABNORMAL LOW (ref 8.4–10.5)
Calcium: 8.2 mg/dL — ABNORMAL LOW (ref 8.4–10.5)
Calcium: 8.2 mg/dL — ABNORMAL LOW (ref 8.4–10.5)
Calcium: 8.2 mg/dL — ABNORMAL LOW (ref 8.4–10.5)
Calcium: 8.7 mg/dL (ref 8.4–10.5)
Chloride: 90 mEq/L — ABNORMAL LOW (ref 96–112)
Chloride: 91 mEq/L — ABNORMAL LOW (ref 96–112)
Chloride: 92 mEq/L — ABNORMAL LOW (ref 96–112)
Creatinine, Ser: 0.58 mg/dL (ref 0.50–1.35)
Creatinine, Ser: 0.58 mg/dL (ref 0.50–1.35)
Creatinine, Ser: 0.64 mg/dL (ref 0.50–1.35)
Creatinine, Ser: 0.67 mg/dL (ref 0.50–1.35)
GFR calc Af Amer: >90 mL/min (ref >90)
GFR calc Af Amer: >90 mL/min (ref >90)
GFR calc Af Amer: >90 mL/min (ref >90)
GFR calc Af Amer: >90 mL/min (ref >90)
GFR calc Af Amer: >90 mL/min (ref >90)
GFR calc non Af Amer: >90 mL/min (ref >90)
GFR calc non Af Amer: >90 mL/min (ref >90)
GFR calc non Af Amer: >90 mL/min (ref >90)
GFR calc non Af Amer: >90 mL/min (ref >90)
GFR calc non Af Amer: >90 mL/min (ref >90)
GFR calc non Af Amer: >90 mL/min (ref >90)
Glucose, Bld: 110 mg/dL — ABNORMAL HIGH (ref 70–99)
Glucose, Bld: 77 mg/dL (ref 70–99)
Glucose, Bld: 80 mg/dL (ref 70–99)
Glucose, Bld: 87 mg/dL (ref 70–99)
Glucose, Bld: 93 mg/dL (ref 70–99)
Glucose, Bld: 95 mg/dL (ref 70–99)
POTASSIUM: 3.7 meq/L (ref 3.7–5.3)
POTASSIUM: 3.9 meq/L (ref 3.7–5.3)
POTASSIUM: 3.9 meq/L (ref 3.7–5.3)
Potassium: 4 mEq/L (ref 3.7–5.3)
Potassium: 3.7 mEq/L (ref 3.7–5.3)
Potassium: 3.7 mEq/L (ref 3.7–5.3)
Sodium: 119 mEq/L — CL (ref 137–147)
Sodium: 122 mEq/L — ABNORMAL LOW (ref 137–147)
Sodium: 122 mEq/L — ABNORMAL LOW (ref 137–147)
Sodium: 124 mEq/L — ABNORMAL LOW (ref 137–147)
Sodium: 125 mEq/L — ABNORMAL LOW (ref 137–147)
Sodium: 127 mEq/L — ABNORMAL LOW (ref 137–147)

## 2014-09-09 LAB — CBC WITH DIFFERENTIAL/PLATELET
BASOS PCT: 0 % (ref 0–1)
Basophils Absolute: 0 10*3/uL (ref 0.0–0.1)
Eosinophils Absolute: 0.1 10*3/uL (ref 0.0–0.7)
Eosinophils Relative: 0 % (ref 0–5)
HCT: 35.1 % — ABNORMAL LOW (ref 39.0–52.0)
HEMOGLOBIN: 12.6 g/dL — AB (ref 13.0–17.0)
Lymphocytes Relative: 14 % (ref 12–46)
Lymphs Abs: 1.9 10*3/uL (ref 0.7–4.0)
MCH: 30.5 pg (ref 26.0–34.0)
MCHC: 35.9 g/dL (ref 30.0–36.0)
MCV: 85 fL (ref 78.0–100.0)
MONO ABS: 1.3 10*3/uL — AB (ref 0.1–1.0)
MONOS PCT: 9 % (ref 3–12)
NEUTROS PCT: 77 % (ref 43–77)
Neutro Abs: 10.4 10*3/uL — ABNORMAL HIGH (ref 1.7–7.7)
Platelets: 355 10*3/uL (ref 150–400)
RBC: 4.13 MIL/uL — ABNORMAL LOW (ref 4.22–5.81)
RDW: 12.3 % (ref 11.5–15.5)
WBC: 13.6 10*3/uL — ABNORMAL HIGH (ref 4.0–10.5)

## 2014-09-09 LAB — GLUCOSE, CAPILLARY
GLUCOSE-CAPILLARY: 77 mg/dL (ref 70–99)
GLUCOSE-CAPILLARY: 88 mg/dL (ref 70–99)
GLUCOSE-CAPILLARY: 114 mg/dL — AB (ref 70–99)
Glucose-Capillary: 105 mg/dL — ABNORMAL HIGH (ref 70–99)
Glucose-Capillary: 77 mg/dL (ref 70–99)
Glucose-Capillary: 67 mg/dL — ABNORMAL LOW (ref 70–99)

## 2014-09-09 LAB — URINALYSIS, ROUTINE W REFLEX MICROSCOPIC
BILIRUBIN URINE: NEGATIVE
GLUCOSE, UA: NEGATIVE mg/dL
KETONES UR: NEGATIVE mg/dL
Leukocytes, UA: NEGATIVE
Nitrite: NEGATIVE
PH: 7 (ref 5.0–8.0)
Protein, ur: NEGATIVE mg/dL
Specific Gravity, Urine: 1.025 (ref 1.005–1.030)
Urobilinogen, UA: 1 mg/dL (ref 0.0–1.0)

## 2014-09-09 LAB — TYPE AND SCREEN
ABO/RH(D): AB POS
Antibody Screen: NEGATIVE

## 2014-09-09 LAB — URINE MICROSCOPIC-ADD ON

## 2014-09-09 LAB — MRSA PCR SCREENING: MRSA by PCR: NEGATIVE

## 2014-09-09 LAB — OSMOLALITY, URINE: OSMOLALITY UR: 256 mosm/kg — AB (ref 390–1090)

## 2014-09-09 LAB — OSMOLALITY: Osmolality: 251 mOsm/kg — ABNORMAL LOW (ref 275–300)

## 2014-09-09 LAB — CORTISOL: CORTISOL PLASMA: 21.9 ug/dL

## 2014-09-09 LAB — TSH: TSH: 2.42 u[IU]/mL (ref 0.350–4.500)

## 2014-09-09 LAB — ABO/RH: ABO/RH(D): AB POS

## 2014-09-09 LAB — AMMONIA: Ammonia: 27 umol/L (ref 11–60)

## 2014-09-09 LAB — SODIUM, URINE, RANDOM: Sodium, Ur: 48 mEq/L

## 2014-09-09 SURGERY — REPAIR, HERNIA, INGUINAL, INCARCERATED
Anesthesia: General | Site: Groin | Laterality: Right

## 2014-09-09 MED ORDER — ROCURONIUM BROMIDE 100 MG/10ML IV SOLN
INTRAVENOUS | Status: AC
  Filled 2014-09-09: qty 1

## 2014-09-09 MED ORDER — VITAMIN B-1 100 MG PO TABS
100.0000 mg | ORAL_TABLET | Freq: Every day | ORAL | Status: DC
  Administered 2014-09-11 – 2014-09-14 (×3): 100 mg via ORAL
  Filled 2014-09-09 (×10): qty 1

## 2014-09-09 MED ORDER — HYDROMORPHONE HCL 1 MG/ML IJ SOLN: 0.2500 mg | INTRAMUSCULAR | Status: DC | PRN

## 2014-09-09 MED ORDER — PROMETHAZINE HCL 25 MG/ML IJ SOLN: 6.2500 mg | INTRAMUSCULAR | Status: DC | PRN

## 2014-09-09 MED ORDER — LORAZEPAM 2 MG/ML IJ SOLN
1.0000 mg | Freq: Four times a day (QID) | INTRAMUSCULAR | Status: DC | PRN
  Administered 2014-09-09 – 2014-09-11 (×7): 1 mg via INTRAVENOUS
  Filled 2014-09-09 (×8): qty 1

## 2014-09-09 MED ORDER — FENTANYL CITRATE 0.05 MG/ML IJ SOLN
INTRAMUSCULAR | Status: DC | PRN
  Administered 2014-09-09: 25 ug via INTRAVENOUS
  Administered 2014-09-09: 50 ug via INTRAVENOUS

## 2014-09-09 MED ORDER — ADULT MULTIVITAMIN W/MINERALS CH
1.0000 | ORAL_TABLET | Freq: Every day | ORAL | Status: DC
  Administered 2014-09-11 – 2014-09-15 (×4): 1 via ORAL
  Filled 2014-09-09 (×10): qty 1

## 2014-09-09 MED ORDER — 0.9 % SODIUM CHLORIDE (POUR BTL) OPTIME
TOPICAL | Status: DC | PRN
  Administered 2014-09-09: 1000 mL

## 2014-09-09 MED ORDER — ONDANSETRON HCL 4 MG/2ML IJ SOLN: 4.0000 mg | Freq: Four times a day (QID) | INTRAMUSCULAR | Status: DC | PRN

## 2014-09-09 MED ORDER — ACETAMINOPHEN 650 MG RE SUPP
650.0000 mg | Freq: Four times a day (QID) | RECTAL | Status: DC | PRN
  Administered 2014-09-16: 650 mg via RECTAL
  Filled 2014-09-09: qty 1

## 2014-09-09 MED ORDER — FOLIC ACID 1 MG PO TABS
1.0000 mg | ORAL_TABLET | Freq: Every day | ORAL | Status: DC
  Administered 2014-09-11 – 2014-09-15 (×4): 1 mg via ORAL
  Filled 2014-09-09 (×10): qty 1

## 2014-09-09 MED ORDER — ONDANSETRON HCL 4 MG PO TABS
4.0000 mg | ORAL_TABLET | Freq: Four times a day (QID) | ORAL | Status: DC | PRN
Start: 2014-09-09 — End: 2014-09-21

## 2014-09-09 MED ORDER — MORPHINE SULFATE 2 MG/ML IJ SOLN: 1.0000 mg | INTRAMUSCULAR | Status: DC | PRN

## 2014-09-09 MED ORDER — HYDRALAZINE HCL 20 MG/ML IJ SOLN
10.0000 mg | INTRAMUSCULAR | Status: DC | PRN
  Administered 2014-09-11 – 2014-09-21 (×7): 10 mg via INTRAVENOUS
  Filled 2014-09-09 (×8): qty 1

## 2014-09-09 MED ORDER — GLYCOPYRROLATE 0.2 MG/ML IJ SOLN
INTRAMUSCULAR | Status: DC | PRN
  Administered 2014-09-09: .4 mg via INTRAVENOUS

## 2014-09-09 MED ORDER — ROCURONIUM BROMIDE 100 MG/10ML IV SOLN
INTRAVENOUS | Status: DC | PRN
  Administered 2014-09-09: 20 mg via INTRAVENOUS

## 2014-09-09 MED ORDER — SODIUM BICARBONATE 4 % IV SOLN
INTRAVENOUS | Status: AC
  Filled 2014-09-09: qty 5

## 2014-09-09 MED ORDER — LIDOCAINE HCL 1 % IJ SOLN
INTRAMUSCULAR | Status: AC
  Filled 2014-09-09: qty 20

## 2014-09-09 MED ORDER — LACTATED RINGERS IV SOLN
INTRAVENOUS | Status: DC | PRN
  Administered 2014-09-09: 01:00:00 via INTRAVENOUS

## 2014-09-09 MED ORDER — GLYCOPYRROLATE 0.2 MG/ML IJ SOLN
INTRAMUSCULAR | Status: AC
  Filled 2014-09-09: qty 2

## 2014-09-09 MED ORDER — LIDOCAINE HCL (CARDIAC) 20 MG/ML IV SOLN
INTRAVENOUS | Status: AC
  Filled 2014-09-09: qty 5

## 2014-09-09 MED ORDER — SODIUM CHLORIDE 0.9 % IV SOLN
INTRAVENOUS | Status: DC | PRN
  Administered 2014-09-09: 02:00:00 via INTRAVENOUS

## 2014-09-09 MED ORDER — PHENYLEPHRINE HCL 10 MG/ML IJ SOLN
INTRAMUSCULAR | Status: DC | PRN
  Administered 2014-09-09 (×2): 40 ug via INTRAVENOUS

## 2014-09-09 MED ORDER — FENTANYL CITRATE 0.05 MG/ML IJ SOLN
INTRAMUSCULAR | Status: AC
  Filled 2014-09-09: qty 5

## 2014-09-09 MED ORDER — ACETAMINOPHEN 325 MG PO TABS: 650.0000 mg | ORAL_TABLET | Freq: Four times a day (QID) | ORAL | Status: DC | PRN

## 2014-09-09 MED ORDER — PHENYLEPHRINE 40 MCG/ML (10ML) SYRINGE FOR IV PUSH (FOR BLOOD PRESSURE SUPPORT)
PREFILLED_SYRINGE | INTRAVENOUS | Status: AC
  Filled 2014-09-09: qty 10

## 2014-09-09 MED ORDER — SUCCINYLCHOLINE CHLORIDE 20 MG/ML IJ SOLN
INTRAMUSCULAR | Status: DC | PRN
  Administered 2014-09-09: 100 mg via INTRAVENOUS

## 2014-09-09 MED ORDER — CEFAZOLIN SODIUM-DEXTROSE 2-3 GM-% IV SOLR
INTRAVENOUS | Status: AC
  Filled 2014-09-09: qty 50

## 2014-09-09 MED ORDER — THIAMINE HCL 100 MG/ML IJ SOLN
100.0000 mg | Freq: Every day | INTRAMUSCULAR | Status: DC
  Administered 2014-09-09 – 2014-09-21 (×7): 100 mg via INTRAVENOUS
  Filled 2014-09-09: qty 1
  Filled 2014-09-09: qty 2
  Filled 2014-09-09 (×6): qty 1
  Filled 2014-09-09: qty 2
  Filled 2014-09-09 (×3): qty 1

## 2014-09-09 MED ORDER — LIDOCAINE HCL (CARDIAC) 20 MG/ML IV SOLN
INTRAVENOUS | Status: DC | PRN
  Administered 2014-09-09: 60 mg via INTRAVENOUS

## 2014-09-09 MED ORDER — ONDANSETRON HCL 4 MG/2ML IJ SOLN
INTRAMUSCULAR | Status: AC
  Filled 2014-09-09: qty 2

## 2014-09-09 MED ORDER — PROPOFOL 10 MG/ML IV BOLUS
INTRAVENOUS | Status: AC
  Filled 2014-09-09: qty 20

## 2014-09-09 MED ORDER — PROPOFOL 10 MG/ML IV BOLUS
INTRAVENOUS | Status: DC | PRN
  Administered 2014-09-09: 150 mg via INTRAVENOUS

## 2014-09-09 MED ORDER — ONDANSETRON HCL 4 MG/2ML IJ SOLN
4.0000 mg | Freq: Four times a day (QID) | INTRAMUSCULAR | Status: DC | PRN
Start: 2014-09-09 — End: 2014-09-21

## 2014-09-09 MED ORDER — SODIUM CHLORIDE 0.9 % IJ SOLN
3.0000 mL | Freq: Two times a day (BID) | INTRAMUSCULAR | Status: DC
  Administered 2014-09-09 – 2014-09-18 (×12): 3 mL via INTRAVENOUS

## 2014-09-09 MED ORDER — SODIUM CHLORIDE 0.9 % IV SOLN
INTRAVENOUS | Status: DC
  Administered 2014-09-09: 09:00:00 via INTRAVENOUS

## 2014-09-09 MED ORDER — EPHEDRINE SULFATE 50 MG/ML IJ SOLN
INTRAMUSCULAR | Status: DC | PRN
  Administered 2014-09-09: 10 mg via INTRAVENOUS
  Administered 2014-09-09: 5 mg via INTRAVENOUS

## 2014-09-09 MED ORDER — NEOSTIGMINE METHYLSULFATE 10 MG/10ML IV SOLN
INTRAVENOUS | Status: DC | PRN
  Administered 2014-09-09: 3 mg via INTRAVENOUS

## 2014-09-09 MED ORDER — LORAZEPAM 1 MG PO TABS
1.0000 mg | ORAL_TABLET | Freq: Four times a day (QID) | ORAL | Status: DC | PRN
  Administered 2014-09-11: 1 mg via ORAL
  Filled 2014-09-09: qty 1

## 2014-09-09 MED ORDER — DEXTROSE 50 % IV SOLN
INTRAVENOUS | Status: AC
  Administered 2014-09-09: 25 mL via INTRAVENOUS
  Filled 2014-09-09: qty 50

## 2014-09-09 MED ORDER — ONDANSETRON HCL 4 MG/2ML IJ SOLN
INTRAMUSCULAR | Status: DC | PRN
  Administered 2014-09-09: 4 mg via INTRAVENOUS

## 2014-09-09 SURGICAL SUPPLY — 46 items
BLADE HEX COATED 2.75 (ELECTRODE) IMPLANT
BLADE SURG 15 STRL LF DISP TIS (BLADE) ×1 IMPLANT
BLADE SURG 15 STRL SS (BLADE)
BLADE SURG SZ10 CARB STEEL (BLADE) ×3 IMPLANT
CANISTER SUCTION 2500CC (MISCELLANEOUS) ×3 IMPLANT
CLOSURE WOUND 1/2 X4 (GAUZE/BANDAGES/DRESSINGS)
DECANTER SPIKE VIAL GLASS SM (MISCELLANEOUS) IMPLANT
DRAIN PENROSE 18X1/2 LTX STRL (DRAIN) ×3 IMPLANT
DRAPE INCISE IOBAN 66X45 STRL (DRAPES) ×3 IMPLANT
DRAPE LAPAROTOMY TRNSV 102X78 (DRAPE) ×1 IMPLANT
ELECT REM PT RETURN 9FT ADLT (ELECTROSURGICAL) ×3
ELECTRODE REM PT RTRN 9FT ADLT (ELECTROSURGICAL) ×1 IMPLANT
GAUZE SPONGE 4X4 12PLY STRL (GAUZE/BANDAGES/DRESSINGS) ×3 IMPLANT
GLOVE BIOGEL PI IND STRL 7.0 (GLOVE) ×1 IMPLANT
GLOVE BIOGEL PI IND STRL 7.5 (GLOVE) ×1 IMPLANT
GLOVE BIOGEL PI INDICATOR 7.0 (GLOVE) ×2
GLOVE BIOGEL PI INDICATOR 7.5 (GLOVE) ×2
GLOVE SS BIOGEL STRL SZ 7.5 (GLOVE) ×1 IMPLANT
GLOVE SUPERSENSE BIOGEL SZ 7.5 (GLOVE) ×2
GOWN STRL REUS W/TWL LRG LVL3 (GOWN DISPOSABLE) ×3 IMPLANT
GOWN STRL REUS W/TWL XL LVL3 (GOWN DISPOSABLE) ×6 IMPLANT
KIT BASIN OR (CUSTOM PROCEDURE TRAY) ×3 IMPLANT
LIQUID BAND (GAUZE/BANDAGES/DRESSINGS) IMPLANT
MESH PROLENE 3X6 (Mesh General) ×3 IMPLANT
NEEDLE HYPO 22GX1.5 SAFETY (NEEDLE) IMPLANT
NEEDLE HYPO 25X1 1.5 SAFETY (NEEDLE) ×3 IMPLANT
NS IRRIG 1000ML POUR BTL (IV SOLUTION) ×3 IMPLANT
PACK BASIC VI WITH GOWN DISP (CUSTOM PROCEDURE TRAY) ×3 IMPLANT
PENCIL BUTTON HOLSTER BLD 10FT (ELECTRODE) ×3 IMPLANT
SPONGE LAP 4X18 X RAY DECT (DISPOSABLE) ×1 IMPLANT
STAPLER VISISTAT 35W (STAPLE) ×2 IMPLANT
STRIP CLOSURE SKIN 1/2X4 (GAUZE/BANDAGES/DRESSINGS) IMPLANT
SUT MNCRL AB 4-0 PS2 18 (SUTURE) ×3 IMPLANT
SUT PROLENE 2 0 CT2 30 (SUTURE) ×16 IMPLANT
SUT SILK 2 0 SH (SUTURE) ×1 IMPLANT
SUT VIC AB 3-0 54XBRD REEL (SUTURE) ×1 IMPLANT
SUT VIC AB 3-0 BRD 54 (SUTURE)
SUT VIC AB 3-0 CT1 27 (SUTURE)
SUT VIC AB 3-0 CT1 TAPERPNT 27 (SUTURE) ×1 IMPLANT
SUT VIC AB 3-0 SH 27 (SUTURE) ×3
SUT VIC AB 3-0 SH 27XBRD (SUTURE) IMPLANT
SYR BULB IRRIGATION 50ML (SYRINGE) ×3 IMPLANT
SYR CONTROL 10ML LL (SYRINGE) ×3 IMPLANT
TAPE CLOTH SURG 6X10 WHT LF (GAUZE/BANDAGES/DRESSINGS) ×2 IMPLANT
TOWEL OR 17X26 10 PK STRL BLUE (TOWEL DISPOSABLE) ×3 IMPLANT
YANKAUER SUCT BULB TIP 10FT TU (MISCELLANEOUS) ×3 IMPLANT

## 2014-09-09 NOTE — Progress Notes (Addendum)
Hypoglycemic Event  CBG: 67 2030  Treatment: D50 IV 25 mL  Symptoms: None  Follow-up CBG: Time: 2045 CBG Result: 114  Possible Reasons for Event: Inadequate meal intake  Comments/MD notified: Tama GanderKatherine Schorr, NP    Dorthey SawyerKoontz, Orma Cheetham M  Remember to initiate Hypoglycemia Order Set & complete

## 2014-09-09 NOTE — Progress Notes (Signed)
Pt admitted after midnight. Please see Dr. Toniann FailKakrakandy admission note.   1. Incarcerated right inguinal hernia - plan to take to OR per surgery team  2. Severe hyponatremia - could be from alcoholism versus dehydration. U osm, Na pending, as well as TSH and cortisol level.  3. Encephalopathy - probably chronic, ammonia level pending  4. Leukocytosis - probably secondary to #1. See #1.  Keep in SDU. CBC and BMP in AM.   Debbora PrestoMAGICK-Devonda Pequignot, MD  [facility] Pager [phone]  If 7PM-7AM, please contact night-coverage [email or web address] Password [email or web address]

## 2014-09-09 NOTE — Anesthesia Preprocedure Evaluation (Addendum)
Anesthesia Evaluation  Patient identified by MRN, date of birth, ID band Patient awake    Reviewed: Allergy & Precautions, H&P , NPO status , Patient's Chart, lab work & pertinent test results  Airway Mallampati: II  TM Distance: >3 FB Neck ROM: Full    Dental no notable dental hx.    Pulmonary Current Smoker,  breath sounds clear to auscultation  Pulmonary exam normal       Cardiovascular hypertension, Pt. on medications Rhythm:Regular Rate:Normal     Neuro/Psych PSYCHIATRIC DISORDERS Dementia CVA, Residual Symptoms    GI/Hepatic negative GI ROS, Neg liver ROS,   Endo/Other  negative endocrine ROS  Renal/GU negative Renal ROS  negative genitourinary   Musculoskeletal negative musculoskeletal ROS (+)   Abdominal   Peds negative pediatric ROS (+)  Hematology negative hematology ROS (+)   Anesthesia Other Findings   Reproductive/Obstetrics negative OB ROS                            Anesthesia Physical Anesthesia Plan  ASA: III and emergent  Anesthesia Plan: General   Post-op Pain Management:    Induction: Intravenous, Rapid sequence and Cricoid pressure planned  Airway Management Planned: Oral ETT  Additional Equipment:   Intra-op Plan:   Post-operative Plan: Extubation in OR  Informed Consent: I have reviewed the patients History and Physical, chart, labs and discussed the procedure including the risks, benefits and alternatives for the proposed anesthesia with the patient or authorized representative who has indicated his/her understanding and acceptance.   Dental advisory given  Plan Discussed with: CRNA  Anesthesia Plan Comments:         Anesthesia Quick Evaluation

## 2014-09-09 NOTE — Transfer of Care (Signed)
Immediate Anesthesia Transfer of Care Note  Patient: [name] [name]  Procedure(s) Performed: Procedure(s): HERNIA REPAIR INGUINAL INCARCERATED with mesh (Right)  Patient Location: PACU  Anesthesia Type:General  Level of Consciousness: awake, sedated and patient cooperative  Airway & Oxygen Therapy: Patient Spontanous Breathing and Patient connected to face mask oxygen  Post-op Assessment: Report given to PACU RN, Post -op Vital signs reviewed and stable and Patient moving all extremities  Post vital signs: Reviewed and stable  Complications: No apparent anesthesia complications

## 2014-09-09 NOTE — Progress Notes (Signed)
Pt agitated unable to answer any preop questions allergies npo status unkown

## 2014-09-09 NOTE — Progress Notes (Signed)
Clinical Social Work Department BRIEF PSYCHOSOCIAL ASSESSMENT 09/09/2014  Patient:  MarkMark     Account Number:  [id number]     Admit date:  09/08/2014  Clinical Social Worker:  Garlan FairKIDD,SUZANNA, LCSWA  Date/Time:  09/09/2014 03:30 PM  Referred by:  RN  Date Referred:  09/09/2014 Referred for  SNF Placement   Other Referral:   Interview type:  Family Other interview type:    PSYCHOSOCIAL DATA Living Status:  FRIEND(S) Admitted from facility:   Level of care:   Primary support name:  Darel HongJudy Atkinson/sister/[phone] Primary support relationship to patient:  SIBLING Degree of support available:   strong    CURRENT CONCERNS Current Concerns  Post-Acute Placement   Other Concerns:    SOCIAL WORK ASSESSMENT / PLAN CSW received referral from RN that pt sister requesting to speak with CSW.    CSW visited pt room. Pt sister not present at bedside at this time. CSW contacted pt sister via telephone. CSW introduced self and explained role. Pt sister discussed that pt sister had been assisting pt with [facility] and pt was planned to move into facility this Friday. Pt sister discussed that she was unsure what plan would be for pt given current hospitalization. CSW discussed that once pt stable to participate in physical therapy then PT can evaluate pt needs, but CSW explained that pt will likely require rehab at SNF prior to admitting to the ALF. Pt sister expressed understanding and agreeable if SNF is the recommendation. CSW discussed that CSW will continue to follow and assist with the initiation of SNF search one pt becomes more stable and able to participate with PT/OT. Pt sister appreciative of CSW support.    [facility] will [facility] pt [facility] and initiate SNF search as pt becomes more stable and able to participate with PT.    CSW to continue to follow to provide support and assist with disposition planning.   Assessment/plan status:   Psychosocial Support/Ongoing Assessment of Needs Other assessment/ plan:   discharge planning   Information/referral to community resources:   [facility] once CSW able to initiate search when pt becomes more stable    PATIENT'S/FAMILY'S RESPONSE TO PLAN OF CARE: Per chart, pt orientation unable to assess at this time. Per chart, pt has a history of dementia. Pt sister supportive and actively involved in pt care and eager to do what is in the best interest of the pt. Pt sister appreciative of CSW call and assistance with navigating SNF placement if needed for pt.    Loletta SpecterSuzanna Kidd, MSW, LCSW Clinical Social Work [phone]

## 2014-09-09 NOTE — Anesthesia Postprocedure Evaluation (Signed)
  Anesthesia Post-op Note  Patient: [name] [name]  Procedure(s) Performed: Procedure(s) (LRB): HERNIA REPAIR INGUINAL INCARCERATED with mesh (Right)  Patient Location: PACU  Anesthesia Type: General  Level of Consciousness: awake and alert   Airway and Oxygen Therapy: Patient Spontanous Breathing  Post-op Pain: mild  Post-op Assessment: Post-op Vital signs reviewed, Patient's Cardiovascular Status Stable, Respiratory Function Stable, Patent Airway and No signs of Nausea or vomiting  Last Vitals:  Filed Vitals:   09/09/14 0800  BP:   Pulse:   Temp: 37.3 C  Resp:     Post-op Vital Signs: stable   Complications: No apparent anesthesia complications

## 2014-09-09 NOTE — Care Management Note (Addendum)
    Page 1 of 2   09/11/2014     8:08:55 PM CARE MANAGEMENT NOTE 09/11/2014  Patient:  [name] [name],[name] [name]   Account Number:  [id number]  Date Initiated:  09/09/2014  Documentation initiated by:  DAVIS,RHONDA  Subjective/Objective Assessment:   HERNIA REPAIR INGUINAL INCARCERATED with mesh     Action/Plan:   home when stable   Anticipated DC Date:  09/12/2014   Anticipated DC Plan:  SKILLED NURSING FACILITY  In-house referral  NA      DC Planning Services  CM consult      PAC Choice  NA   Choice offered to / List presented to:  NA   DME arranged  NA      DME agency  NA     HH arranged  NA      HH agency  NA   Status of service:  In process, will continue to follow Medicare Important Message given?  YES (If response is "NO", the following Medicare IM given date fields will be blank) Date Medicare IM given:  09/11/2014 Medicare IM given by:  [facility]Kentrell Guettler Date Additional Medicare IM given:   Additional Medicare IM given by:    Discharge Disposition:    Per UR Regulation:  Reviewed for med. necessity/level of care/duration of stay  If discussed at Long Length of Stay Meetings, dates discussed:    Comments:  09/11/14 [facility] RN,BSN NCM [phone] 1:1 [facility]TER-D/C SNF.  [id number]/Rhonda Earlene Plateravis, RN, BSN, CCM Chart reviewed. Discharge needs and patient's stay to be reviewed and followed by case manager. Chart note for progression of stay: unresponsive this Am, Assessment/Plan Incarcerated right inguinal hernia HERNIA REPAIR INGUINAL INCARCERATED with mesh, Hoxworth, Lorne SkeensBenjamin T, MD.09/09/14 Frequent falls/AMS/encephalopathy/Hyponatremia/Hx of dementia /Hx of alcoholism/Hypertension  SCD for DVT/currently Plan:  hydratedtoday, and if alert and stable we can start liquids in AM.

## 2014-09-09 NOTE — Progress Notes (Signed)
Day of Surgery  Subjective: He is unresponsive this Am, even taking his dressing off did not cause him to open his eyes.  He does have Mittens on and did extend his right arm while taking off dressing. BP 142/63 mmHg  Pulse 79  Temp(Src) 99.1 F (37.3 C) (Axillary)  Resp 12  Ht 5\' 6"  (1.676 m)  Wt 51.8 kg (114 lb 3.2 oz)  BMI 18.44 kg/m2  SpO2 100% On monitior.  Objective: Vital signs in last 24 hours: Temp:  [97 F (36.1 C)-97.8 F (36.6 C)] 97.5 F (36.4 C) (11/18 0258) Pulse Rate:  [67-104] 79 (11/18 0500) Resp:  [10-18] 12 (11/18 0500) BP: (119-191)/(50-115) 142/63 mmHg (11/18 0500) SpO2:  [97 %-100 %] 100 % (11/18 0500) Weight:  [51.8 kg (114 lb 3.2 oz)] 51.8 kg (114 lb 3.2 oz) (11/18 0258)    Intake/Output from previous day: 11/17 0701 - 11/18 0700 In: 2300 [I.V.:2300] Out: 2175 [Urine:2150; Blood:25] Intake/Output this shift:    General appearance: unresponsive, will not open his eyes, or react to any mild stimulus. GI: soft, few BS, incision looks fine.    Lab Results:   Recent Labs  09/08/14 2021 09/09/14 0340  WBC 19.8* 13.6*  HGB 13.9 12.6*  HCT 38.6* 35.1*  PLT 363 355    BMET  Recent Labs  09/09/14 0340 09/09/14 0733  NA 124* 122*  K 4.0 3.9  CL 90* 89*  CO2 24 22  GLUCOSE 110* 93  BUN 5* 4*  CREATININE 0.64 0.64  CALCIUM 8.2* 8.2*   PT/INR  Recent Labs  09/08/14 2021  LABPROT 12.8  INR 0.95     Recent Labs Lab 09/08/14 2021  AST 31  ALT 13  ALKPHOS 63  BILITOT 0.7  PROT 7.5  ALBUMIN 4.0     Lipase  No results found for: LIPASE   Studies/Results: Dg Chest 2 View  09/08/2014   CLINICAL DATA:  WEAKNESS, confusion  EXAM: CHEST - 2 VIEW  COMPARISON:  04/08/2008  FINDINGS: Calcified lingular nodules and left hilar lymph nodes, stable. Lungs are otherwise clear. Heart size and mediastinal contours are within normal limits. No effusion. Visualized skeletal structures are unremarkable.  IMPRESSION: 1. No acute  cardiopulmonary disease. 2. Old granulomatous disease.   Electronically Signed   By: Oley Balmaniel  Hassell M.D.   On: 09/08/2014 20:25   Ct Head Wo Contrast  09/08/2014   CLINICAL DATA:  Tremors, frequent falls, altered mental status, history hypertension, stroke, dementia, smoking  EXAM: CT HEAD WITHOUT CONTRAST  TECHNIQUE: Contiguous axial images were obtained from the base of the skull through the vertex without intravenous contrast. Motion artifacts on initial images, for which repeat imaging was performed.  COMPARISON:  04/08/2008  FINDINGS: Generalized atrophy.  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  Old small BILATERAL occipital infarcts as well.  No midline shift or mass effect.  Multiple old lacunar infarcts BILATERAL basal ganglia and RIGHT thalamus.  Probable small old infarct RIGHT cerebellar hemisphere.  Extensive small vessel chronic ischemic changes of deep cerebral white matter.  No intracranial hemorrhage, mass lesion, or evidence acute infarction.  No extra-axial fluid collections.  Bones demineralized.  Visualized sinuses and mastoid air cells clear.  Atherosclerotic calcifications at skullbase.  IMPRESSION: Atrophy with small vessel chronic ischemic changes of deep cerebral white matter.  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  No definite acute intracranial abnormalities.   Electronically  Signed   By: Ulyses SouthwardMark  Boles M.D.   On: 09/08/2014 22:06   Ct Abdomen Pelvis W Contrast  09/08/2014   CLINICAL DATA:  RIGHT inguinal hernia, bursal history of hypertension, stroke, dementia, smoking  EXAM: CT ABDOMEN AND PELVIS WITH CONTRAST  TECHNIQUE: Multidetector CT imaging of the abdomen and pelvis was performed using the standard protocol following bolus administration of intravenous contrast. Sagittal and coronal MPR images reconstructed from axial data set. Examination degraded by patient  motion.  CONTRAST:  100mL OMNIPAQUE IOHEXOL 300 MG/ML SOLN IV. No oral contrast administered.  COMPARISON:  None  FINDINGS: Minimal bibasilar atelectasis at motion artifacts.  Extensive atherosclerotic calcifications aorta, celiac artery, SMA, RIGHT renal artery and coronary arteries.  Within limitations of motion, liver, spleen, pancreas, kidneys, and adrenal glands normal.  RIGHT inguinal hernia containing small bowel loops.  Dilatation of a few intra-abdominal small bowel loops is identified compatible with small bowel obstruction at the hernia.  Distal small bowel loops and colon appear decompressed.  Minimal bowel wall thickening and hyperemia of small bowel loops proximal to the hernia suggesting incarceration and edema.  No free intraperitoneal air identified within motion limitations.  Distended bladder.  No mass, adenopathy, or free fluid.  Bones demineralized.  IMPRESSION: RIGHT inguinal hernia containing small bowel loops.  Associated dilatation of intra-abdominal small bowel loops compatible with small bowel obstruction secondary to RIGHT inguinal hernia, with some of a small bowel loops proximal to the hernia demonstrating hyperemia and minimal wall thickening suggesting incarceration.  No gross evidence of perforation or abscess on exam limited by patient motion.  Extensive atherosclerotic disease.   Electronically Signed   By: Ulyses SouthwardMark  Boles M.D.   On: 09/08/2014 22:14    Medications: . folic acid  1 mg Oral Daily  . multivitamin with minerals  1 tablet Oral Daily  . sodium chloride  3 mL Intravenous Q12H  . thiamine  100 mg Oral Daily   Or  . thiamine  100 mg Intravenous Daily  . tuberculin  5 Units Intradermal Once   . sodium chloride 100 mL/hr at 09/09/14 0350   Prior to Admission medications   Medication Sig Start Date End Date Taking? Authorizing Provider  lisinopril (PRINIVIL,ZESTRIL) 20 MG tablet Take 20 mg by mouth daily.   Yes Historical Provider, MD  lisinopril  (PRINIVIL,ZESTRIL) 10 MG tablet Take 10 mg by mouth daily.    Historical Provider, MD     Assessment/Plan Incarcerated right inguinal hernia HERNIA REPAIR INGUINAL INCARCERATED with mesh, Hoxworth, Lorne SkeensBenjamin T, MD.09/09/14 Frequent falls/AMS/encephalopathy Hyponatremia Hx of dementia  Hx of alcoholism Hypertension  SCD for DVT/currently  Plan:  From our standpoint I would keep him hydrated today, and if he is alert and stable we can start liquids in AM.  He can have DVT prophylaxis this evening.    LOS: 1 day    Sante Biedermann 09/09/2014

## 2014-09-09 NOTE — Op Note (Signed)
Preoperative Diagnosis:Incarcerated right inguinal hernia  Postoprative Diagnosis: Same  Procedure: Procedure(s): HERNIA REPAIR INGUINAL INCARCERATED with mesh   Surgeon: Glenna FellowsHoxworth, Justyce Yeater T   Assistants: None  Anesthesia:  General endotracheal anesthesia  Indications: Patient is a 63 year old male with multiple medical problems who presents to the emergency department brought in by his family with complaints of weakness and falling. On examination he is been found to have an incarcerated right inguinal hernia which on CT scan contains small bowel with evidence of small bowel obstruction. It is tender and not reducible. I discussed with his family proceeding with emergency repair. We discussed the nature of the surgery and indications as well as risks of anesthetic complications, cardiorespiratory complications, stroke, bleeding or infection. They understand and agree to proceed.    Procedure Detail:  Patient was brought to the operating room, placed in supine position on the operating table, and general endotracheal anesthesia induced. He received preoperative IV antibiotics. Foley catheter was in place. The abdomen was widely sterilely prepped and draped. Patient timeout was performed and correct procedure verified. There was a large hernia mass in the right groin. I made an oblique incision in the right groin from the estimated position of the pubic tubercle superior laterally. Dissection was carried down through the subcutaneous tissue. The external oblique was identified laterally and divided along the lines of its fibers through the external ring. The hernia sac was mobilized from surrounding soft tissue. The spermatic cord was identified and using blunt and cautery dissection completely dissected away from the hernia sac and retracted laterally. The sac contained fluid and was opened. The fluid was straw-colored. There were several loops of edematous small bowel but no ischemia. Some  interloop adhesions and adhesions to the hernia sac were divided and the bowel was able to be reduced through the defect. Examination of the defect showed that it appeared to actually be a direct hernia with a large pseudocyst sac. The pseudo-sac was debrided down toward the floor the inguinal canal and then closed with a running 2-0 Prolene suture. The inguinal ligament and pubic tubercle and rectus sheath and internal ring were all identified and dissected free. A piece of Prolene mesh was trimmed to size to fit the floor the inguinal canal with tails around the cord at the internal ring. It was sutured initially to the pubic tubercle and then working medial to lateral sutured with running Prolene to the inguinal ligament out well lateral to the internal ring. Medially the mesh was sutured to the edge of the rectus sheath with interrupted 2-0 Prolene providing broad coverage of the direct and indirect spaces. The tails were tacked together lateral to the cord creating a new internal ring. The cord was returned to its anatomic position. The wound was thoroughly irrigated and complete hemostasis assured. The external oblique was closed with running 3-0 Vicryl. Scarpa's fascia was closed with running 3-0 Vicryl and the skin closed with staples. Sponge needle and instrument counts were correct.    Findings: Incarcerated right inguinal hernia with bowel obstruction but no ischemia  Estimated Blood Loss:  Minimal         Drains: none  Blood Given: none          Specimens: None        Complications:  * No complications entered in OR log *         Disposition: PACU - hemodynamically stable.         Condition: stable

## 2014-09-09 NOTE — H&P (Signed)
[facility] History and Physical  [name] [name] ZOX:[id number] DOB: 18-Apr-1951 DOA: 09/08/2014  Referring physician: ER physician. PCP: Kaleen Mask, MD   Chief Complaint: Frequent falls.  HISTORY OBTAINED FROM PATIENT'S SISTER AS PATIENT IS CONFUSED.  HPI: [name] [name] is a 62 y.o. male history of hypertension and alcoholism and previous stroke was brought to the ER after patient was found to have frequent falls. Patient lives with his friend in an apartment and is frequently checked by his sister. Patient's sister stated that patient was recently diagnosed with dementia and is largely dependent on others for his daily living activities. Over the last 1 week patient's friend noted that patient has been having frequent falls but did not lose consciousness. So patient was brought to the ER. In the ER CT of the head did not show anything acute and on exam patient had large right inguinal hernia which was not reducible and CT abdomen and pelvis showed features concerning for obstruction and on-call surgeon was consulted. Patient on my exam is confused and is oriented to his name only. As per patient's family patient did not have any nausea vomiting or did not complain of any abdominal pain chest pain or shortness of breath. Labs revealed leukocytosis and severe hyponatremia. Patient's sister say that he does drink a lot of alcohol but not sure if he has been drinking last 1 month.   Review of Systems: As presented in the history of presenting illness, rest negative.  Past Medical History  Diagnosis Date  . Hypertension   . Stroke   . Dementia    History reviewed. No pertinent past surgical history. Social History:  reports that he has been smoking.  He does not have any smokeless tobacco history on file. He reports that he drinks alcohol. He reports that he does not use illicit drugs. Where does patient live home. Can patient participate in ADLs? Yes.  No Known  Allergies  Family History:  Family History  Problem Relation Age of Onset  . Dementia Mother       Prior to Admission medications   Medication Sig Start Date End Date Taking? Authorizing Provider  lisinopril (PRINIVIL,ZESTRIL) 20 MG tablet Take 20 mg by mouth daily.   Yes Historical Provider, MD  lisinopril (PRINIVIL,ZESTRIL) 10 MG tablet Take 10 mg by mouth daily.    Historical Provider, MD    Physical Exam: Filed Vitals:   09/08/14 1823 09/08/14 2232 09/09/14 0001  BP: 191/115 121/64 130/61  Pulse: 104 92 80  Temp: 97.8 F (36.6 C)    TempSrc: Oral    Resp: 18 14 14   SpO2: 100% 100% 100%     General:  Moderately built and poorly nourished.  Eyes: anicteric no pallor.  ENT: no discharge from the ears eyes nose and mouth.  Neck: no neck rigidity.  Cardiovascular: S1-S2 heard.  Respiratory: no rhonchi or crepitations.  Abdomen: right inguinal hernia nonreducible.  Skin: chronic skin changes.  Musculoskeletal: no edema.  Psychiatric: patient is confused.  Neurologic: patient is confused and does not follow commands. Further neurological exam is difficult.  Labs on Admission:  Basic Metabolic Panel:  Recent Labs Lab 09/08/14 2021  NA 117*  K 4.1  CL 82*  CO2 21  GLUCOSE 94  BUN 6  CREATININE 0.59  CALCIUM 9.4   Liver Function Tests:  Recent Labs Lab 09/08/14 2021  AST 31  ALT 13  ALKPHOS 63  BILITOT 0.7  PROT 7.5  ALBUMIN 4.0   No results  for input(s): LIPASE, AMYLASE in the last 168 hours. No results for input(s): AMMONIA in the last 168 hours. CBC:  Recent Labs Lab 09/08/14 2021  WBC 19.8*  NEUTROABS 15.2*  HGB 13.9  HCT 38.6*  MCV 84.8  PLT 363   Cardiac Enzymes:  Recent Labs Lab 09/08/14 2021  TROPONINI <0.30    BNP (last 3 results) No results for input(s): PROBNP in the last 8760 hours. CBG: No results for input(s): GLUCAP in the last 168 hours.  Radiological Exams on Admission: Dg Chest 2 View  09/08/2014    CLINICAL DATA:  WEAKNESS, confusion  EXAM: CHEST - 2 VIEW  COMPARISON:  04/08/2008  FINDINGS: Calcified lingular nodules and left hilar lymph nodes, stable. Lungs are otherwise clear. Heart size and mediastinal contours are within normal limits. No effusion. Visualized skeletal structures are unremarkable.  IMPRESSION: 1. No acute cardiopulmonary disease. 2. Old granulomatous disease.   Electronically Signed   By: Oley Balmaniel  Hassell M.D.   On: 09/08/2014 20:25   Ct Head Wo Contrast  09/08/2014   CLINICAL DATA:  Tremors, frequent falls, altered mental status, history hypertension, stroke, dementia, smoking  EXAM: CT HEAD WITHOUT CONTRAST  TECHNIQUE: Contiguous axial images were obtained from the base of the skull through the vertex without intravenous contrast. Motion artifacts on initial images, for which repeat imaging was performed.  COMPARISON:  04/08/2008  FINDINGS: Generalized atrophy.  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  Old small BILATERAL occipital infarcts as well.  No midline shift or mass effect.  Multiple old lacunar infarcts BILATERAL basal ganglia and RIGHT thalamus.  Probable small old infarct RIGHT cerebellar hemisphere.  Extensive small vessel chronic ischemic changes of deep cerebral white matter.  No intracranial hemorrhage, mass lesion, or evidence acute infarction.  No extra-axial fluid collections.  Bones demineralized.  Visualized sinuses and mastoid air cells clear.  Atherosclerotic calcifications at skullbase.  IMPRESSION: Atrophy with small vessel chronic ischemic changes of deep cerebral white matter.  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  No definite acute intracranial abnormalities.   Electronically Signed   By: Ulyses SouthwardMark  Boles M.D.   On: 09/08/2014 22:06   Ct Abdomen Pelvis W Contrast  09/08/2014   CLINICAL DATA:  RIGHT inguinal hernia, bursal history of  hypertension, stroke, dementia, smoking  EXAM: CT ABDOMEN AND PELVIS WITH CONTRAST  TECHNIQUE: Multidetector CT imaging of the abdomen and pelvis was performed using the standard protocol following bolus administration of intravenous contrast. Sagittal and coronal MPR images reconstructed from axial data set. Examination degraded by patient motion.  CONTRAST:  100mL OMNIPAQUE IOHEXOL 300 MG/ML SOLN IV. No oral contrast administered.  COMPARISON:  None  FINDINGS: Minimal bibasilar atelectasis at motion artifacts.  Extensive atherosclerotic calcifications aorta, celiac artery, SMA, RIGHT renal artery and coronary arteries.  Within limitations of motion, liver, spleen, pancreas, kidneys, and adrenal glands normal.  RIGHT inguinal hernia containing small bowel loops.  Dilatation of a few intra-abdominal small bowel loops is identified compatible with small bowel obstruction at the hernia.  Distal small bowel loops and colon appear decompressed.  Minimal bowel wall thickening and hyperemia of small bowel loops proximal to the hernia suggesting incarceration and edema.  No free intraperitoneal air identified within motion limitations.  Distended bladder.  No mass, adenopathy, or free fluid.  Bones demineralized.  IMPRESSION: RIGHT inguinal hernia containing small bowel loops.  Associated dilatation of intra-abdominal small bowel loops compatible  with small bowel obstruction secondary to RIGHT inguinal hernia, with some of a small bowel loops proximal to the hernia demonstrating hyperemia and minimal wall thickening suggesting incarceration.  No gross evidence of perforation or abscess on exam limited by patient motion.  Extensive atherosclerotic disease.   Electronically Signed   By: Ulyses SouthwardMark  Boles M.D.   On: 09/08/2014 22:14    EKG: Independently reviewed. Normal sinus rhythm.  Assessment/Plan Principal Problem:   Incarcerated inguinal hernia Active Problems:   Hyponatremia   Hypertension   Encephalopathy    Leucocytosis   Alcoholism   1. Incarcerated right inguinal hernia - I have discussed with on-call surgeon Dr. [name] will be taking patient for emergency surgery for his right inguinal incarcerated hernia. Further recommendations per surgery. Patient will be getting 1 dose of Ancef prior to the surgery as patient has also leukocytosis probably from hernia. Dr. [name] said that if patient has signs of ischemic bowel then may continue the antibiotics. 2. Severe hyponatremia - could be from alcoholism versus dehydration. Patient did receive some IV fluids in the ER. At this time I have ordered urine sodium and osmolality and serum osmolality and frequent metabolic panel checks. Also check TSH and cortisol levels. Based on the labs ordered and sodium trends we'll have further recommendations. 3. Encephalopathy - probably chronic. Patient's family states that his mental status is at baseline and was recently diagnosed with dementia. Since patient also has history of alcoholism I have ordered ammonia and thiamine level and patient will be placed on IV thiamine. 4. Hypertension - since patient is nothing by mouth I have placed patient on when necessary IV hydralazine for now. 5. History of alcoholism - patient sisters say that patient may not be drinking as much as before. For now I have placed patient on when necessary IV Ativan with thiamine. 6. Leukocytosis - probably secondary to #1. See #1.    Code Status: DO NOT RESUSCITATE.  Family Communication: patient's sister thru the phone.  Disposition Plan: admit to inpatient.    Abcde Oneil N. [facility] Pager [phone].  If 7PM-7AM, please contact night-coverage [email or web address] Password [email or web address] 09/09/2014, 12:11 AM

## 2014-09-10 ENCOUNTER — Encounter (HOSPITAL_COMMUNITY): Payer: Self-pay | Admitting: General Surgery

## 2014-09-10 DIAGNOSIS — E43 Unspecified severe protein-calorie malnutrition: Secondary | ICD-10-CM | POA: Insufficient documentation

## 2014-09-10 LAB — CBC
HCT: 33.4 % — ABNORMAL LOW (ref 39.0–52.0)
HEMOGLOBIN: 11.9 g/dL — AB (ref 13.0–17.0)
MCH: 30.5 pg (ref 26.0–34.0)
MCHC: 35.6 g/dL (ref 30.0–36.0)
MCV: 85.6 fL (ref 78.0–100.0)
Platelets: 317 10*3/uL (ref 150–400)
RBC: 3.9 MIL/uL — ABNORMAL LOW (ref 4.22–5.81)
RDW: 12.4 % (ref 11.5–15.5)
WBC: 11.6 10*3/uL — ABNORMAL HIGH (ref 4.0–10.5)

## 2014-09-10 LAB — BASIC METABOLIC PANEL
Anion gap: 10 (ref 5–15)
BUN: <3 mg/dL — ABNORMAL LOW (ref 6–23)
CO2: 23 mEq/L (ref 19–32)
Calcium: 8.3 mg/dL — ABNORMAL LOW (ref 8.4–10.5)
Chloride: 94 mEq/L — ABNORMAL LOW (ref 96–112)
Creatinine, Ser: 0.54 mg/dL (ref 0.50–1.35)
GFR calc Af Amer: >90 mL/min (ref >90)
GFR calc non Af Amer: >90 mL/min (ref >90)
Glucose, Bld: 108 mg/dL — ABNORMAL HIGH (ref 70–99)
Potassium: 3.3 mEq/L — ABNORMAL LOW (ref 3.7–5.3)
Sodium: 127 mEq/L — ABNORMAL LOW (ref 137–147)

## 2014-09-10 LAB — GLUCOSE, CAPILLARY
GLUCOSE-CAPILLARY: 96 mg/dL (ref 70–99)
Glucose-Capillary: 102 mg/dL — ABNORMAL HIGH (ref 70–99)
Glucose-Capillary: 110 mg/dL — ABNORMAL HIGH (ref 70–99)
Glucose-Capillary: 113 mg/dL — ABNORMAL HIGH (ref 70–99)
Glucose-Capillary: 113 mg/dL — ABNORMAL HIGH (ref 70–99)
Glucose-Capillary: 61 mg/dL — ABNORMAL LOW (ref 70–99)
Glucose-Capillary: 95 mg/dL (ref 70–99)

## 2014-09-10 MED ORDER — DEXTROSE-NACL 5-0.9 % IV SOLN
INTRAVENOUS | Status: DC
  Administered 2014-09-10 (×3): via INTRAVENOUS

## 2014-09-10 MED ORDER — CHLORHEXIDINE GLUCONATE 0.12 % MT SOLN
15.0000 mL | Freq: Two times a day (BID) | OROMUCOSAL | Status: DC
  Administered 2014-09-11 – 2014-09-20 (×12): 15 mL via OROMUCOSAL
  Filled 2014-09-10 (×26): qty 15

## 2014-09-10 MED ORDER — CETYLPYRIDINIUM CHLORIDE 0.05 % MT LIQD
7.0000 mL | Freq: Two times a day (BID) | OROMUCOSAL | Status: DC
  Administered 2014-09-11 – 2014-09-20 (×12): 7 mL via OROMUCOSAL

## 2014-09-10 MED ORDER — DEXTROSE 50 % IV SOLN
25.0000 mL | Freq: Once | INTRAVENOUS | Status: AC
  Administered 2014-09-10: 25 mL via INTRAVENOUS

## 2014-09-10 MED ORDER — POTASSIUM CHLORIDE 2 MEQ/ML IV SOLN
INTRAVENOUS | Status: DC
  Administered 2014-09-10: 17:00:00 via INTRAVENOUS
  Filled 2014-09-10 (×3): qty 1000

## 2014-09-10 MED ORDER — NICOTINE 21 MG/24HR TD PT24
21.0000 mg | MEDICATED_PATCH | Freq: Every day | TRANSDERMAL | Status: DC
  Administered 2014-09-10 – 2014-09-21 (×12): 21 mg via TRANSDERMAL
  Filled 2014-09-10 (×12): qty 1

## 2014-09-10 NOTE — Progress Notes (Signed)
Patient ID: Mark [name], male   DOB: [birth year], 63 y.o.   MRN: [id number]  [facility] PROGRESS NOTE  Mark [name] [id number] DOB: [birth year] DOA: 09/08/2014 PCP: [name],[name], MD  Brief narrative: 63 y.o. male history of hypertension and alcoholism, previous stroke was brought to the ER after patient was found to have frequent falls. Patient lives with his friend in an apartment and is frequently checked by his sister. Patient's sister stated that patient was recently diagnosed with dementia and is largely dependent on others for his daily living activities. Over the last 1 week patient's friend noted that patient has been having frequent falls but did not lose consciousness. So patient was brought to the ER. In the ER CT of the head did not show anything acute and on exam patient had large right inguinal hernia which was not reducible and CT abdomen and pelvis showed features concerning for obstruction, on-call surgeon was consulted.   Assessment and Plan:    Principal Problem:   Incarcerated inguinal hernia - stable this AM, still confused  - status post incarcerated inguinal hernia repair with mesh, post op day #1 - management per surgery team, appreciate assistance  - SLP prior to advancing diet Active Problems:   Hyponatremia - continue gentle hydration, repeat BMP in AM   Hypokalemia - will continue to supplement and repeat BMP in AM   Hypertension - reasonable inpatient control, SBP in 110's this AM   Encephalopathy - possibly secondary to alcohol effects, post op anesthesia, underlying dementia - CT head with evidence of prior strokes, vascular ischemia  - more alert this AM - transfer to telemetry bed    Leucocytosis - WBC trending down, 19.8 --> 13.6 - possibly related to the incarcerated hernia - CBC in AM   Alcoholism - will consult once pt able to participate    Severe PCM - SLP requested   DVT prophylaxis  Lovenox SQ while pt is in  hospital  Code Status: DNR Family Communication: No family at bedside  Disposition Plan: Remains inpatient    IV Access:   Peripheral IV Diagnostic studies:   Dg Chest 2 View  09/08/2014   No acute cardiopulmonary disease. Old granulomatous disease.   Ct Head Wo Contrast  09/08/2014 Atrophy with small vessel chronic ischemic changes of deep cerebral white matter.  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  No definite acute intracranial abnormalities.  Ct Abdomen Pelvis W Contrast  09/08/2014   RIGHT inguinal hernia containing small bowel loops.  Associated dilatation of intra-abdominal small bowel loops compatible with small bowel obstruction secondary to RIGHT inguinal hernia, with some of a small bowel loops proximal to the hernia demonstrating hyperemia and minimal wall thickening suggesting incarceration.  No gross evidence of perforation or abscess on exam limited by patient motion.   Medical Consultants:   Surgery  Other Consultants:   Physical therapy  Anti-Infectives:   None  Debbora PrestoMAGICK-Brendalyn Vallely, MD  [facility] Pager [phone]  If 7PM-7AM, please contact night-coverage [email or web address] Password [email or web address] 09/10/2014, 7:11 AM   LOS: 2 days   HPI/Subjective: No events overnight.   Objective: Filed Vitals:   09/10/14 0200 09/10/14 0400 09/10/14 0424 09/10/14 0600  BP: 110/52 138/45  113/53  Pulse: 64 71  64  Temp:   98.7 F (37.1 C)   TempSrc:   Oral   Resp: 14 14  14   Height:      Weight:      SpO2: 100% 99%  99%  Intake/Output Summary (Last 24 hours) at 09/10/14 0711 Last data filed at 09/10/14 0631  Gross per 24 hour  Intake   2300 ml  Output   2040 ml  Net    260 ml    Exam:   General:  Pt is alert, follows some commands appropriately, not in acute distress, confused   Cardiovascular: Regular rate and rhythm, no rubs, no gallops  Respiratory: Clear to auscultation bilaterally, no wheezing, no crackles, no rhonchi  Abdomen: Soft, non tender,  non distended, bowel sounds present, no guarding  Data Reviewed: Basic Metabolic Panel:  Recent Labs Lab 09/09/14 0340 09/09/14 0733 09/09/14 1125 09/09/14 1515 09/09/14 1858  NA 124* 122* 122* 127* 125*  K 4.0 3.9 3.7 3.9 3.7  CL 90* 89* 91* 95* 92*  CO2 24 22 22 20 22   GLUCOSE 110* 93 87 80 77  BUN 5* 4* 4* 3* 3*  CREATININE 0.64 0.64 0.67 0.65 0.58  CALCIUM 8.2* 8.2* 8.1* 8.2* 8.2*   Liver Function Tests:  Recent Labs Lab 09/08/14 2021  AST 31  ALT 13  ALKPHOS 63  BILITOT 0.7  PROT 7.5  ALBUMIN 4.0   No results for input(s): LIPASE, AMYLASE in the last 168 hours.  Recent Labs Lab 09/09/14 0007  AMMONIA 27   CBC:  Recent Labs Lab 09/08/14 2021 09/09/14 0340  WBC 19.8* 13.6*  NEUTROABS 15.2* 10.4*  HGB 13.9 12.6*  HCT 38.6* 35.1*  MCV 84.8 85.0  PLT 363 355   Cardiac Enzymes:  Recent Labs Lab 09/08/14 2021  TROPONINI <0.30   BNP: Invalid input(s): POCBNP CBG:  Recent Labs Lab 09/09/14 2008 09/09/14 2047 09/09/14 2358 09/10/14 0018 09/10/14 0422  GLUCAP 67* 114* 61* 110* 96    Recent Results (from the past 240 hour(s))  MRSA PCR Screening     Status: None   Collection Time: 09/09/14  3:08 AM  Result Value Ref Range Status   MRSA by PCR NEGATIVE NEGATIVE Final    Comment:        The GeneXpert MRSA Assay (FDA approved for NASAL specimens only), is one component of a comprehensive MRSA colonization surveillance program. It is not intended to diagnose MRSA infection nor to guide or monitor treatment for MRSA infections.      Scheduled Meds: . folic acid  1 mg Oral Daily  . multivitamin with minerals  1 tablet Oral Daily  . sodium chloride  3 mL Intravenous Q12H  . thiamine  100 mg Oral Daily   Or  . thiamine  100 mg Intravenous Daily  . tuberculin  5 Units Intradermal Once   Continuous Infusions: . dextrose 5 % and 0.9% NaCl 100 mL/hr at 09/10/14 0036

## 2014-09-10 NOTE — Progress Notes (Signed)
INITIAL NUTRITION ASSESSMENT  DOCUMENTATION CODES Per approved criteria  -Severe malnutrition in the context of chronic illness -Underweight  Pt meets criteria for severe MALNUTRITION in the context of chronic illness as evidenced by severe muscle wasting and severe subcutaneous fat loss.   INTERVENTION: -Diet advancement per MD -Recommend SLP consult d/t dementia, confused and hx of stroke -Recommend addition of supplements as diet advancement tolerated d/t severe muscle wasting, fat loss, and underweight status -RD to continue to monitor  NUTRITION DIAGNOSIS: Inadequate oral intake related to inability to eat as evidenced by NPO status.   Goal: Pt to meet >/= 90% of their estimated nutrition needs    Monitor:  Total protein/energy intake, labs, weights, diet order, GI profile  Reason for Assessment: MST/Underweight BMI  63 y.o. male  Admitting Dx: Incarcerated inguinal hernia  ASSESSMENT: Mark [name] is a 63 y.o. male history of hypertension and alcoholism and previous stroke was brought to the ER after patient was found to have frequent falls. Patient lives with his friend in an apartment and is frequently checked by his sister. Patient's sister stated that patient was recently diagnosed with dementia and is largely dependent on others for his daily living activities. Over the last 1 week patient's friend noted that patient has been having frequent falls but did not lose consciousness  -Pt confused, no family in room to provide further food/nutrition hx -Pt s/p hernia repair. Is currently NPO pending swallowing ability -Has hx of ETOH abuse, and pt acknowledges weight loss and poor appetite pta. Could not provide further details -Has had hypoglycemic episode on 11/19 d/t NPO status, NP order D5. CBGs now WNL Nutrition Focused Physical Exam:  Subcutaneous Fat:  Orbital Region: severe wasting Upper Arm Region: n/a, pt in Programme researcher, broadcasting/film/videomittens Thoracic and Lumbar Region:  n/a  Muscle:  Temple Region: moderate wasting Clavicle Bone Region: moderate wasting Clavicle and Acromion Bone Region: moderate Scapular Bone Region: n/a Dorsal Hand: n/a Patellar Region: severe wasting Anterior Thigh Region: severe wasting Posterior Calf Region: severe wasting  Edema: none noted     Height: Ht Readings from Last 1 Encounters:  09/09/14 5\' 6"  (1.676 m)    Weight: Wt Readings from Last 1 Encounters:  09/09/14 114 lb 3.2 oz (51.8 kg)    Ideal Body Weight: 142 lb  % Ideal Body Weight: 80%  Wt Readings from Last 10 Encounters:  09/09/14 114 lb 3.2 oz (51.8 kg)    Usual Body Weight: unable to determine  % Usual Body Weight: unable to determine  BMI:  Body mass index is 18.44 kg/(m^2). Underweight  Estimated Nutritional Needs: Kcal: 1600-1800 Protein: 75-85 gram Fluid: >/= 1600 ml daily  Skin: WDL  Diet Order:    EDUCATION NEEDS: -No education needs identified at this time   Intake/Output Summary (Last 24 hours) at 09/10/14 1111 Last data filed at 09/10/14 1000  Gross per 24 hour  Intake   2300 ml  Output   2165 ml  Net    135 ml    Last BM: PTA   Labs:   Recent Labs Lab 09/09/14 1125 09/09/14 1515 09/09/14 1858  NA 122* 127* 125*  K 3.7 3.9 3.7  CL 91* 95* 92*  CO2 22 20 22   BUN 4* 3* 3*  CREATININE 0.67 0.65 0.58  CALCIUM 8.1* 8.2* 8.2*  GLUCOSE 87 80 77    CBG (last 3)   Recent Labs  09/10/14 0018 09/10/14 0422 09/10/14 0813  GLUCAP 110* 96 102*    Scheduled  Meds: . antiseptic oral rinse  7 mL Mouth Rinse q12n4p  . chlorhexidine  15 mL Mouth Rinse BID  . folic acid  1 mg Oral Daily  . multivitamin with minerals  1 tablet Oral Daily  . nicotine  21 mg Transdermal Daily  . sodium chloride  3 mL Intravenous Q12H  . thiamine  100 mg Oral Daily   Or  . thiamine  100 mg Intravenous Daily  . tuberculin  5 Units Intradermal Once    Continuous Infusions: . dextrose 5 % and 0.9% NaCl 100 mL/hr at 09/10/14  1044    Past Medical History  Diagnosis Date  . Hypertension   . Stroke   . Dementia     History reviewed. No pertinent past surgical history.  Lloyd HugerSarah F Kelani Robart MS RD LDN Clinical Dietitian [phone]

## 2014-09-10 NOTE — Progress Notes (Addendum)
Hypoglycemic Event  CBG: 61 at 0005  Treatment: D50 IV 25 mL  Symptoms: None  Follow-up CBG: Time: 0020 CBG Result: 110  Possible Reasons for Event: Inadequate meal intake  Comments/MD notified: Tama GanderKatherine Schorr, NP; Fluids changed to D5 NaCl    Dorthey SawyerKoontz, Kandance Yano M  Remember to initiate Hypoglycemia Order Set & complete

## 2014-09-10 NOTE — Progress Notes (Signed)
1 Day Post-Op  Subjective: He is awake, moving, talking, but still very confused.  Thinks he's at home, pulling at mittens, year 2005.  Follows some instructions and answers questions this AM.  Objective: Vital signs in last 24 hours: Temp:  [98.7 F (37.1 C)-99.7 F (37.6 C)] 98.7 F (37.1 C) (11/19 0424) Pulse Rate:  [58-95] 64 (11/19 0600) Resp:  [10-17] 14 (11/19 0600) BP: (110-138)/(41-78) 113/53 mmHg (11/19 0600) SpO2:  [98 %-100 %] 99 % (11/19 0600) Last BM Date:  (pta) NPO Tm 99.7 VSS No labs I have ordered some No films Intake/Output from previous day: 11/18 0701 - 11/19 0700 In: 2300 [I.V.:2300] Out: 2040 [Urine:2040] Intake/Output this shift:    General appearance: alert and speaking, moving in bed, confused, but plesant. Resp: clear to auscultation bilaterally GI: soft, non-tender; bowel sounds normal; no masses,  no organomegaly and his RIH site looks fine.  Lab Results:   Recent Labs  09/08/14 2021 09/09/14 0340  WBC 19.8* 13.6*  HGB 13.9 12.6*  HCT 38.6* 35.1*  PLT 363 355    BMET  Recent Labs  09/09/14 1515 09/09/14 1858  NA 127* 125*  K 3.9 3.7  CL 95* 92*  CO2 20 22  GLUCOSE 80 77  BUN 3* 3*  CREATININE 0.65 0.58  CALCIUM 8.2* 8.2*   PT/INR  Recent Labs  09/08/14 2021  LABPROT 12.8  INR 0.95     Recent Labs Lab 09/08/14 2021  AST 31  ALT 13  ALKPHOS 63  BILITOT 0.7  PROT 7.5  ALBUMIN 4.0     Lipase  No results found for: LIPASE   Studies/Results: Dg Chest 2 View  09/08/2014   CLINICAL DATA:  WEAKNESS, confusion  EXAM: CHEST - 2 VIEW  COMPARISON:  04/08/2008  FINDINGS: Calcified lingular nodules and left hilar lymph nodes, stable. Lungs are otherwise clear. Heart size and mediastinal contours are within normal limits. No effusion. Visualized skeletal structures are unremarkable.  IMPRESSION: 1. No acute cardiopulmonary disease. 2. Old granulomatous disease.   Electronically Signed   By: Oley Balmaniel  Hassell M.D.   On:  09/08/2014 20:25   Ct Head Wo Contrast  09/08/2014   CLINICAL DATA:  Tremors, frequent falls, altered mental status, history hypertension, stroke, dementia, smoking  EXAM: CT HEAD WITHOUT CONTRAST  TECHNIQUE: Contiguous axial images were obtained from the base of the skull through the vertex without intravenous contrast. Motion artifacts on initial images, for which repeat imaging was performed.  COMPARISON:  04/08/2008  FINDINGS: Generalized atrophy.  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  Old small BILATERAL occipital infarcts as well.  No midline shift or mass effect.  Multiple old lacunar infarcts BILATERAL basal ganglia and RIGHT thalamus.  Probable small old infarct RIGHT cerebellar hemisphere.  Extensive small vessel chronic ischemic changes of deep cerebral white matter.  No intracranial hemorrhage, mass lesion, or evidence acute infarction.  No extra-axial fluid collections.  Bones demineralized.  Visualized sinuses and mastoid air cells clear.  Atherosclerotic calcifications at skullbase.  IMPRESSION: Atrophy with small vessel chronic ischemic changes of deep cerebral white matter.  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  No definite acute intracranial abnormalities.   Electronically Signed   By: Ulyses SouthwardMark  Boles M.D.   On: 09/08/2014 22:06   Ct Abdomen Pelvis W Contrast  09/08/2014   CLINICAL DATA:  RIGHT inguinal hernia, bursal history of hypertension, stroke, dementia, smoking  EXAM: CT ABDOMEN AND PELVIS WITH CONTRAST  TECHNIQUE: Multidetector CT imaging of the abdomen and pelvis was performed using the standard protocol following bolus administration of intravenous contrast. Sagittal and coronal MPR images reconstructed from axial data set. Examination degraded by patient motion.  CONTRAST:  100mL OMNIPAQUE IOHEXOL 300 MG/ML SOLN IV. No oral contrast administered.  COMPARISON:  None   FINDINGS: Minimal bibasilar atelectasis at motion artifacts.  Extensive atherosclerotic calcifications aorta, celiac artery, SMA, RIGHT renal artery and coronary arteries.  Within limitations of motion, liver, spleen, pancreas, kidneys, and adrenal glands normal.  RIGHT inguinal hernia containing small bowel loops.  Dilatation of a few intra-abdominal small bowel loops is identified compatible with small bowel obstruction at the hernia.  Distal small bowel loops and colon appear decompressed.  Minimal bowel wall thickening and hyperemia of small bowel loops proximal to the hernia suggesting incarceration and edema.  No free intraperitoneal air identified within motion limitations.  Distended bladder.  No mass, adenopathy, or free fluid.  Bones demineralized.  IMPRESSION: RIGHT inguinal hernia containing small bowel loops.  Associated dilatation of intra-abdominal small bowel loops compatible with small bowel obstruction secondary to RIGHT inguinal hernia, with some of a small bowel loops proximal to the hernia demonstrating hyperemia and minimal wall thickening suggesting incarceration.  No gross evidence of perforation or abscess on exam limited by patient motion.  Extensive atherosclerotic disease.   Electronically Signed   By: Ulyses SouthwardMark  Boles M.D.   On: 09/08/2014 22:14    Medications: . folic acid  1 mg Oral Daily  . multivitamin with minerals  1 tablet Oral Daily  . sodium chloride  3 mL Intravenous Q12H  . thiamine  100 mg Oral Daily   Or  . thiamine  100 mg Intravenous Daily  . tuberculin  5 Units Intradermal Once    Assessment/Plan HERNIA REPAIR INGUINAL INCARCERATED with mesh, Hoxworth, Sharlet SalinaBenjamin T, MD.09/09/14 Frequent falls/AMS/encephalopathy Hyponatremia Hx of dementia  Hx of alcoholism Hypertension SCD for DVT/currently   Plan:  When safe for swallowing I would start him on clears and advance as tolerated.  I have ordered some labs for this Am.  Will defer to medicine. He can have  heparin or Lovenox for DVT prophylaxis. I have added SCD's.    LOS: 2 days    Arnett Duddy 09/10/2014

## 2014-09-11 ENCOUNTER — Inpatient Hospital Stay (HOSPITAL_COMMUNITY): Payer: Medicare Other

## 2014-09-11 LAB — CBC
HEMATOCRIT: 37.2 % — AB (ref 39.0–52.0)
HEMOGLOBIN: 13.5 g/dL (ref 13.0–17.0)
MCH: 31 pg (ref 26.0–34.0)
MCHC: 36.3 g/dL — AB (ref 30.0–36.0)
MCV: 85.3 fL (ref 78.0–100.0)
Platelets: 396 10*3/uL (ref 150–400)
RBC: 4.36 MIL/uL (ref 4.22–5.81)
RDW: 12.3 % (ref 11.5–15.5)
WBC: 12.1 10*3/uL — ABNORMAL HIGH (ref 4.0–10.5)

## 2014-09-11 LAB — URINALYSIS, ROUTINE W REFLEX MICROSCOPIC
GLUCOSE, UA: NEGATIVE mg/dL
Nitrite: NEGATIVE
PH: 7 (ref 5.0–8.0)
Protein, ur: NEGATIVE mg/dL
Specific Gravity, Urine: 1.018 (ref 1.005–1.030)
Urobilinogen, UA: 2 mg/dL — ABNORMAL HIGH (ref 0.0–1.0)

## 2014-09-11 LAB — URINE MICROSCOPIC-ADD ON

## 2014-09-11 LAB — COMPREHENSIVE METABOLIC PANEL
ALBUMIN: 3.3 g/dL — AB (ref 3.5–5.2)
ALK PHOS: 55 U/L (ref 39–117)
ALT: 11 U/L (ref 0–53)
AST: 27 U/L (ref 0–37)
Anion gap: 15 (ref 5–15)
BUN: 3 mg/dL — ABNORMAL LOW (ref 6–23)
CO2: 21 mEq/L (ref 19–32)
Calcium: 8.8 mg/dL (ref 8.4–10.5)
Chloride: 89 mEq/L — ABNORMAL LOW (ref 96–112)
Creatinine, Ser: 0.51 mg/dL (ref 0.50–1.35)
GFR calc non Af Amer: >90 mL/min (ref >90)
GLUCOSE: 120 mg/dL — AB (ref 70–99)
Potassium: 3.4 mEq/L — ABNORMAL LOW (ref 3.7–5.3)
SODIUM: 125 meq/L — AB (ref 137–147)
TOTAL PROTEIN: 6.5 g/dL (ref 6.0–8.3)
Total Bilirubin: 0.5 mg/dL (ref 0.3–1.2)

## 2014-09-11 LAB — GLUCOSE, CAPILLARY
GLUCOSE-CAPILLARY: 144 mg/dL — AB (ref 70–99)
GLUCOSE-CAPILLARY: 71 mg/dL (ref 70–99)
GLUCOSE-CAPILLARY: 82 mg/dL (ref 70–99)
GLUCOSE-CAPILLARY: 95 mg/dL (ref 70–99)
Glucose-Capillary: 123 mg/dL — ABNORMAL HIGH (ref 70–99)
Glucose-Capillary: 98 mg/dL (ref 70–99)
Glucose-Capillary: 116 mg/dL — ABNORMAL HIGH (ref 70–99)

## 2014-09-11 LAB — VITAMIN B1: VITAMIN B1 (THIAMINE): 8 nmol/L (ref 8–30)

## 2014-09-11 MED ORDER — UNJURY CHICKEN SOUP POWDER
1.0000 | Freq: Every day | ORAL | Status: DC
  Filled 2014-09-11: qty 27

## 2014-09-11 MED ORDER — SODIUM CHLORIDE 0.45 % IV SOLN
INTRAVENOUS | Status: DC
  Administered 2014-09-11 – 2014-09-12 (×2): via INTRAVENOUS
  Filled 2014-09-11 (×3): qty 1000

## 2014-09-11 MED ORDER — LORAZEPAM 2 MG/ML IJ SOLN
1.0000 mg | Freq: Once | INTRAMUSCULAR | Status: AC
  Administered 2014-09-11: 1 mg via INTRAVENOUS

## 2014-09-11 NOTE — Progress Notes (Signed)
2 Days Post-Op  Subjective: Much more with it today.  Awaiting swallow study.  Site looks fine.  Objective: Vital signs in last 24 hours: Temp:  [98.3 F (36.8 C)-98.7 F (37.1 C)] 98.7 F (37.1 C) (11/20 0403) Pulse Rate:  [72-109] 97 (11/20 0548) Resp:  [19-27] 19 (11/20 0548) BP: (138-170)/(68-104) 146/69 mmHg (11/20 0548) SpO2:  [99 %-100 %] 100 % (11/20 0548) Last BM Date:  (pta)  Intake/Output from previous day: 11/19 0701 - 11/20 0700 In: 1518.8 [I.V.:1518.8] Out: 1452 [Urine:1452] Intake/Output this shift:    General appearance: alert, cooperative, no distress and much less confused, and coherent. Skin: Skin color, texture, turgor normal. No rashes or lesions or RIH site looks good, +BS.  no distension of abdomen.  Lab Results:   Recent Labs  09/10/14 1000 09/11/14 0525  WBC 11.6* 12.1*  HGB 11.9* 13.5  HCT 33.4* 37.2*  PLT 317 396    BMET  Recent Labs  09/10/14 1000 09/11/14 0525  NA 127* 125*  K 3.3* 3.4*  CL 94* 89*  CO2 23 21  GLUCOSE 108* 120*  BUN <3* 3*  CREATININE 0.54 0.51  CALCIUM 8.3* 8.8   PT/INR  Recent Labs  09/08/14 2021  LABPROT 12.8  INR 0.95     Recent Labs Lab 09/08/14 2021 09/11/14 0525  AST 31 27  ALT 13 11  ALKPHOS 63 55  BILITOT 0.7 0.5  PROT 7.5 6.5  ALBUMIN 4.0 3.3*     Lipase  No results found for: LIPASE   Studies/Results: No results found.  Medications: . antiseptic oral rinse  7 mL Mouth Rinse q12n4p  . chlorhexidine  15 mL Mouth Rinse BID  . folic acid  1 mg Oral Daily  . multivitamin with minerals  1 tablet Oral Daily  . nicotine  21 mg Transdermal Daily  . sodium chloride  3 mL Intravenous Q12H  . thiamine  100 mg Oral Daily   Or  . thiamine  100 mg Intravenous Daily    Assessment/Plan HERNIA REPAIR INGUINAL INCARCERATED with mesh, Hoxworth, Sharlet SalinaBenjamin T, MD.09/09/14 Frequent falls/AMS/encephalopathy Hyponatremia Hx of dementia  Hx of alcoholism Hypertension SCD for  DVT/currently Malnutrition   From our standpoint he can have his diet advanced as tolerated.  You can clean site with soap and water, it does not need a dressing, just keep it clean and dry.  He can ambulate and resume most of his normal activity.  I have a follow up appointment for staple removal at our office and to see Dr. [name].  I will put post op care instructions in the AVS.  No lifting over 10 pounds for 4 weeks.  We will see again as needed.      LOS: 3 days    Donice Alperin 09/11/2014

## 2014-09-11 NOTE — Evaluation (Signed)
Clinical/Bedside Swallow Evaluation Patient Details  Name: Mark [name] MRN: [id number] Date of Birth: 1950/12/13  Today's Date: 09/11/2014 Time: 1028-1042 SLP Time Calculation (min) (ACUTE ONLY): 14 min  Past Medical History:  Past Medical History  Diagnosis Date  . Hypertension   . Stroke   . Dementia    Past Surgical History:  Past Surgical History  Procedure Laterality Date  . Inguinal hernia repair Right 09/09/2014    Procedure: HERNIA REPAIR INGUINAL INCARCERATED with mesh;  Surgeon: Glenna FellowsBenjamin Hoxworth, MD;  Location: [facility];  Service: General;  Laterality: Right;   HPI:  63 y.o. male history of hypertension and alcoholism and previous stroke was brought to the ER after patient was found to have frequent falls. Patient lives with his friend in an apartment and is frequently checked by his sister.  Recent dx of dementia, needs assist with ADLs.  Pt CCT negative; CT abdomen incarcerated right inguinal hernia requiring surgery 11/19.  Encephalopathic.     Assessment / Plan / Recommendation Clinical Impression  Pt confused, disoriented x 3, but with functional oropharyngeal swallow with sufficient mastication, consistent swallow response, and no s/s of aspiration.  Recommend starting with clears per MD; advance as tolerated.  No SLP f/u is warranted - will sign off.  **  Pt's tongue with thrush-like appearance - may need tx.     Aspiration Risk  Mild    Diet Recommendation Thin liquid (per MD, then advance to solids when medically ready)   Liquid Administration via: Cup;Straw Medication Administration: Whole meds with liquid (give with purees if coughing) Supervision: Patient able to self feed    Other  Recommendations Oral Care Recommendations: Oral care BID   Follow Up Recommendations  None      Swallow Study Prior Functional Status       General Date of Onset: 09/08/14 HPI: 63 y.o. male history of hypertension and alcoholism and previous stroke was brought to  the ER after patient was found to have frequent falls. Patient lives with his friend in an apartment and is frequently checked by his sister.  Recent dx of dementia, needs assist with ADLs.  Pt CCT negative; CT abdomen incarcerated right inguinal hernia requiring surgery 11/19.  Encephalopathic.   Type of Study: Bedside swallow evaluation Previous Swallow Assessment: none per records Diet Prior to this Study: NPO Temperature Spikes Noted: No Respiratory Status: Room air Behavior/Cognition: Alert;Confused Oral Cavity - Dentition: Edentulous Self-Feeding Abilities: Able to feed self Patient Positioning: Upright in bed Baseline Vocal Quality: Clear Volitional Cough: Strong Volitional Swallow: Able to elicit    Oral/Motor/Sensory Function Overall Oral Motor/Sensory Function: Appears within functional limits for tasks assessed   Ice Chips Ice chips: Within functional limits Presentation: Spoon   Thin Liquid Thin Liquid: Within functional limits Presentation: Cup;Straw    Nectar Thick Nectar Thick Liquid: Not tested   Honey Thick Honey Thick Liquid: Not tested   Puree Puree: Within functional limits Presentation: Spoon   Solid  Mark Raney L. [place], [place] CCC/SLP Pager [phone]     Solid: Not tested (medical restrictions s/p surgery)       Blenda Mountsouture, Lucan Riner Laurice 09/11/2014,10:42 AM

## 2014-09-11 NOTE — Evaluation (Signed)
Occupational Therapy Evaluation Patient Details Name: Mark [name] MRN: [id number] DOB: 22-Aug-1951 Today's Date: 09/11/2014    History of Present Illness Mark [name] is a 63 y.o. male history of hypertension and alcoholism and previous stroke was brought to the ER after patient was found to have frequent  falls. Pt is s/p HERNIA REPAIR INGUINAL INCARCERATED with mesh on 09/09/14   Clinical Impression   Pt is a high fall risk as he is very unsteady and impulsive. He doesn't give advanced notice before standing or sitting down. He is unaware of his lines/tubes. Will follow on acute to progress ADL independence. He is fixated on need to urinate despite having catheter. +2 for safety and management of lines/tubes.     Follow Up Recommendations  SNF;Supervision/Assistance - 24 hour    Equipment Recommendations  3 in 1 bedside comode    Recommendations for Other Services       Precautions / Restrictions Precautions Precautions: Fall Restrictions Weight Bearing Restrictions: No      Mobility Bed Mobility   Bed Mobility: Supine to Sit;Sit to Supine     Supine to sit: Min assist;+2 for safety/equipment Sit to supine: Min assist;+2 for safety/equipment   General bed mobility comments: +2 for safety as pt moves quickly and not aware of lines/tubes.  Transfers Overall transfer level: Needs assistance Equipment used: Rolling walker (2 wheeled) Transfers: Sit to/from Stand Sit to Stand: Min assist;+2 safety/equipment         General transfer comment: pt is impulsive and not aware of his lines/tubes.     Balance Overall balance assessment: Needs assistance         Standing balance support: During functional activity;Bilateral upper extremity supported Standing balance-Leahy Scale: Poor                              ADL Overall ADL's : Needs assistance/impaired Eating/Feeding:  (not tested)   Grooming: Wash/dry face;Set up;Supervision/safety;Bed  level   Upper Body Bathing: Bed level;Minimal assitance   Lower Body Bathing: +2 for safety/equipment;Maximal assistance;Sit to/from stand   Upper Body Dressing : Moderate assistance;Bed level   Lower Body Dressing: Total assistance;+2 for safety/equipment;Sit to/from stand   Toilet Transfer: +2 for safety/equipment;Moderate assistance;RW;Ambulation;BSC Toilet Transfer Details (indicate cue type and reason): pt moves quickly. stands up and sits down without notice. very unsteady.  Toileting- Clothing Manipulation and Hygiene: +2 for safety/equipment;Total assistance;Sit to/from stand         General ADL Comments: Pt not aware of lines/tubes and is very impulsive. Doesnt give indication that he will stand or sit. He is very unsteady with walker use and tends to push it too far, doesnt grip handles well and needs assist to steer walker for turns. Pt repeatedly stating he needs to use bathroom but has catheter. [facility] need [facility] but no BM. Assisted pt back to bed. +2 for all activity for safety. Pt trying to get back into bed before lines could be untangled.      Vision                 Additional Comments: not able to assess this visit. pt family states pt wears glasses but doesnt have them here.    Perception     Praxis      Pertinent Vitals/Pain Pain Assessment: No/denies pain     Hand Dominance     Extremity/Trunk Assessment Upper Extremity Assessment  Upper Extremity Assessment: Generalized weakness   Lower Extremity Assessment Lower Extremity Assessment: Generalized weakness   Cervical / Trunk Assessment Cervical / Trunk Assessment: Kyphotic   Communication Communication Communication: No difficulties   Cognition Arousal/Alertness: Awake/alert Behavior During Therapy: WFL for tasks assessed/performed Overall Cognitive Status: History of cognitive impairments - at baseline                     General Comments        Exercises       Shoulder Instructions      Home Living Family/patient expects to be discharged to:: Skilled nursing facility Living Arrangements: Other (Comment) (roommate)                                      Prior Functioning/Environment Level of Independence: Needs assistance  Gait / Transfers Assistance Needed: Has been getting around without device but frequent falls per family. ADL's / Homemaking Assistance Needed: has assist for meals and laundry. Pt is able to feed himself and per family, needs prompting by roommate to do shower regularly. Has assist with shaving.        OT Diagnosis: Generalized weakness   OT Problem List: Decreased strength;Decreased knowledge of use of DME or AE;Decreased safety awareness   OT Treatment/Interventions: Self-care/ADL training;Patient/family education;Therapeutic activities;DME and/or AE instruction    OT Goals(Current goals can be found in the care plan section) Acute Rehab OT Goals Patient Stated Goal: none stated OT Goal Formulation: Patient unable to participate in goal setting Time For Goal Achievement: 09/25/14 Potential to Achieve Goals: Good  OT Frequency: Min 2X/week   Barriers to D/C:            Co-evaluation PT/OT/SLP Co-Evaluation/Treatment: Yes Reason for Co-Treatment: For patient/therapist safety   OT goals addressed during session: ADL's and self-care      End of Session Equipment Utilized During Treatment: Gait belt;Rolling walker  Activity Tolerance: Patient tolerated treatment well Patient left: in bed;with call bell/phone within reach;with nursing/sitter in room   Time: 1157-1217 OT Time Calculation (min): 20 min Charges:  OT General Charges $OT Visit: 1 Procedure OT Evaluation $Initial OT Evaluation Tier I: 1 Procedure G-Codes:    Lennox LaityStone, Nayla Dias Stafford  [phone] 09/11/2014, 1:40 PM

## 2014-09-11 NOTE — Progress Notes (Deleted)
Nutrition Note  Per discussion with RN, pt disliking Resource Breeze supplement. Will discontinue. Drinks Boost occasionally, and has been consuming food brought in from family.Discussed addition of Unjury chicken broth protein supplement for a savory supplement alternative. Pt agreeable to try.   RD to continue to monitor per protocols. Please consult as needed.  Nathan Stallworth F Braeson Rupe MS RD LDN Clinical Dietitian Pager:[phone] 

## 2014-09-11 NOTE — Progress Notes (Signed)
Pt very agitated  and trying to pull off telemetry , pull out IV , and get OOB despite safety sitter at bedside. Dr. Izola PriceMyers Notified. New orders received. Will continue to monitor.

## 2014-09-11 NOTE — Progress Notes (Signed)
CSW reviewed PT evaluation recommending SNF at discharge. CSW spoke with patient's sister, Darel HongJudy who is agreeable with plan for SNF for short-term rehab, then ALF after rehab is complete. CSW will follow-up with bed offers.    Clinical Social Work Department CLINICAL SOCIAL WORK PLACEMENT NOTE 09/11/2014  Patient:  Petrasek,Wilford  Account Number:  [id number] Admit date:  09/08/2014  Clinical Social Worker:  Orpah GreekKELLY FOLEY, LCSWA  Date/time:  09/11/2014 03:18 PM  Clinical Social Work is seeking post-discharge placement for this patient at the following level of care:   SKILLED NURSING   (*CSW will update this form in Epic as items are completed)   09/11/2014  [facility]/family provided with [facility] of Clinical Social Work's list of facilities offering this level of care within the geographic area requested by the patient (or if unable, by the patient's family).  09/11/2014  Patient/family informed of their freedom to choose among providers that offer the needed level of care, that participate in Medicare, Medicaid or managed care program needed by the patient, have an available bed and are willing to accept the patient.  09/11/2014  Patient/family informed of [facility]' ownership interest in [facility], as well as of the fact that they are under no obligation to receive care at this facility.  PASARR submitted to EDS on 09/11/2014 PASARR number received on 09/11/2014  FL2 transmitted to all facilities in geographic area requested by pt/family on  09/11/2014 FL2 transmitted to all facilities within larger geographic area on   Patient informed that his/her managed care company has contracts with or will negotiate with  certain facilities, including the following:     Patient/family informed of bed offers received:   Patient chooses bed at  Physician recommends and patient chooses bed at    Patient to be transferred to  on   Patient to be transferred to  facility by  Patient and family notified of transfer on  Name of family member notified:    The following physician request were entered in Epic:   Additional Comments:   Lincoln MaxinKelly Annaclaire Walsworth, LCSW [facility] Clinical Social Worker cell #: [phone]

## 2014-09-11 NOTE — Plan of Care (Signed)
Problem: Phase I Progression Outcomes Goal: Tubes/drains patent Outcome: Completed/Met Date Met:  09/11/14

## 2014-09-11 NOTE — Plan of Care (Signed)
Problem: Phase I Progression Outcomes Goal: Incision/dressings dry and intact Outcome: Completed/Met Date Met:  09/11/14     

## 2014-09-11 NOTE — Progress Notes (Addendum)
Patient ID: Mark [name], male   DOB: 1951/05/03, 63 y.o.   MRN: [id number]  [facility] PROGRESS NOTE  Mark [name] WUJ:[id number] DOB: 1951/05/03 DOA: 09/08/2014 PCP: [name],[name], MD  Brief narrative: 63 y.o. male history of hypertension and alcoholism, previous stroke was brought to the ER after patient was found to have frequent falls. Patient lives with his friend in an apartment and is frequently checked by his sister. Patient's sister stated that patient was recently diagnosed with dementia and is largely dependent on others for his daily living activities. Over the last 1 week patient's friend noted that patient has been having frequent falls but did not lose consciousness. So patient was brought to the ER. In the ER CT of the head did not show anything acute and on exam patient had large right inguinal hernia which was not reducible and CT abdomen and pelvis showed features concerning for obstruction, on-call surgeon was consulted.   Assessment and Plan:    Principal Problem:  Incarcerated inguinal hernia - stable this AM, still confused  - status post incarcerated inguinal hernia repair with mesh, post op day #2 - management per surgery team, appreciate assistance  - SLP prior to advancing diet, plan for SLP today  Active Problems:  Hyponatremia - continue gentle hydration, repeat BMP in AM - Na is still low but hopefully once pt starts eating this will improve   Hypokalemia - will continue to supplement and repeat BMP in AM  Hypertension - reasonable inpatient control, SBP in 110's this AM  Encephalopathy - possibly secondary to alcohol effects, post op anesthesia, underlying dementia - CT head with evidence of prior strokes, vascular ischemia  - more alert this AM - sitter at bedside   Leucocytosis - WBC trending down, 19.8 --> 13.6 --> 12.1, no clear etiology  - possibly related to the incarcerated hernia, ? UTI, and will certainly have  to rule out PNA given tachypnea and tachycardia - place order of UA and CXR  - CBC in AM  Alcoholism - will consult once pt able to participate   Severe PCM - SLP requested    Acute functional quadriplegia - PT /OT evaluation   DVT prophylaxis  SCD's while pt is in hospital  Code Status: DNR Family Communication: No family at bedside  Disposition Plan: Remains inpatient    IV Access:    Peripheral IV Diagnostic studies:    Dg Chest 2 View 09/08/2014 No acute cardiopulmonary disease. Old granulomatous disease.   Ct Head Wo Contrast 09/08/2014 Atrophy with small vessel chronic ischemic changes of deep cerebral white matter. 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 No definite acute intracranial abnormalities.   Ct Abdomen Pelvis W Contrast 09/08/2014 RIGHT inguinal hernia containing small bowel loops. Associated dilatation of intra-abdominal small bowel loops compatible with small bowel obstruction secondary to RIGHT inguinal hernia, with some of a small bowel loops proximal to the hernia demonstrating hyperemia and minimal wall thickening suggesting incarceration. No gross evidence of perforation or abscess on exam limited by patient motion.  Medical Consultants:    Surgery  Other Consultants:    Physical therapy  Anti-Infectives:    None   Debbora PrestoMAGICK-Almond Fitzgibbon, MD  [facility] Pager [phone]  If 7PM-7AM, please contact night-coverage [email or web address] Password [email or web address] 09/11/2014, 8:42 AM   LOS: 3 days   HPI/Subjective: No events overnight.   Objective: Filed Vitals:   09/10/14 2355 09/11/14 0112 09/11/14 0403 09/11/14 0548  BP: 168/90 138/91 149/81 146/69  Pulse: 96  109 107 97  Temp: 98.6 F (37 C)  98.7 F (37.1 C)   TempSrc: Axillary  Axillary   Resp: 20 20 19 19   Height:      Weight:      SpO2: 100% 100% 100% 100%    Intake/Output Summary (Last 24 hours) at 09/11/14 0842 Last data filed at 09/11/14 0500  Gross per  24 hour  Intake 1418.75 ml  Output   1327 ml  Net  91.75 ml    Exam:   General:  Pt is alert, follows some commands appropriately, not in acute distress  Cardiovascular: Regular rate and rhythm, no rubs, no gallops  Respiratory: Clear to auscultation bilaterally, no wheezing, no crackles, no rhonchi  Abdomen: Soft, non tender, non distended, bowel sounds present, no guarding   Data Reviewed: Basic Metabolic Panel:  Recent Labs Lab 09/09/14 1125 09/09/14 1515 09/09/14 1858 09/10/14 1000 09/11/14 0525  NA 122* 127* 125* 127* 125*  K 3.7 3.9 3.7 3.3* 3.4*  CL 91* 95* 92* 94* 89*  CO2 22 20 22 23 21   GLUCOSE 87 80 77 108* 120*  BUN 4* 3* 3* <3* 3*  CREATININE 0.67 0.65 0.58 0.54 0.51  CALCIUM 8.1* 8.2* 8.2* 8.3* 8.8   Liver Function Tests:  Recent Labs Lab 09/08/14 2021 09/11/14 0525  AST 31 27  ALT 13 11  ALKPHOS 63 55  BILITOT 0.7 0.5  PROT 7.5 6.5  ALBUMIN 4.0 3.3*    Recent Labs Lab 09/09/14 0007  AMMONIA 27   CBC:  Recent Labs Lab 09/08/14 2021 09/09/14 0340 09/10/14 1000 09/11/14 0525  WBC 19.8* 13.6* 11.6* 12.1*  NEUTROABS 15.2* 10.4*  --   --   HGB 13.9 12.6* 11.9* 13.5  HCT 38.6* 35.1* 33.4* 37.2*  MCV 84.8 85.0 85.6 85.3  PLT 363 355 317 396   Cardiac Enzymes: CBG:  Recent Labs Lab 09/10/14 1716 09/10/14 1956 09/10/14 2351 09/11/14 0358 09/11/14 0800  GLUCAP 113* 113* 98 123* 116*    Recent Results (from the past 240 hour(s))  MRSA PCR Screening     Status: None   Collection Time: 09/09/14  3:08 AM  Result Value Ref Range Status   MRSA by PCR NEGATIVE NEGATIVE Final    Comment:        The GeneXpert MRSA Assay (FDA approved for NASAL specimens only), is one component of a comprehensive MRSA colonization surveillance program. It is not intended to diagnose MRSA infection nor to guide or monitor treatment for MRSA infections.      Scheduled Meds: . antiseptic oral rinse  7 mL Mouth Rinse q12n4p  . chlorhexidine   15 mL Mouth Rinse BID  . folic acid  1 mg Oral Daily  . multivitamin with minerals  1 tablet Oral Daily  . nicotine  21 mg Transdermal Daily  . sodium chloride  3 mL Intravenous Q12H  . thiamine  100 mg Oral Daily   Or  . thiamine  100 mg Intravenous Daily   Continuous Infusions: . dextrose 5 % with kcl 75 mL/hr at 09/10/14 1725

## 2014-09-11 NOTE — Evaluation (Signed)
Physical Therapy Evaluation Patient Details Name: Mark [name] MRN: [id number] DOB: 02/09/1951 Today's Date: 09/11/2014   History of Present Illness  Mark BilberryMichael Massie is a 63 y.o. male history of hypertension and alcoholism and previous stroke was brought to the ER after patient was found to have frequent  falls. Pt is s/p HERNIA REPAIR INGUINAL INCARCERATED with mesh on 09/09/14  Clinical Impression  On eval, pt required Min-Mod assist +2 for mobility-able to ambulate ~75 feet with walker. Very unsteady and at high risk for falls. Recommend SNf for continued rehab.    Follow Up Recommendations SNF;Supervision/Assistance - 24 hour    Equipment Recommendations  Rolling walker with 5" wheels    Recommendations for Other Services OT consult     Precautions / Restrictions Precautions Precautions: Fall Restrictions Weight Bearing Restrictions: No      Mobility  Bed Mobility   Bed Mobility: Supine to Sit;Sit to Supine     Supine to sit: Min assist;+2 for safety/equipment Sit to supine: Min assist;+2 for safety/equipment   General bed mobility comments: +2 for safety as pt moves quickly and not aware of lines/tubes.  Transfers Overall transfer level: Needs assistance Equipment used: Rolling walker (2 wheeled) Transfers: Sit to/from Stand Sit to Stand: Min assist;+2 safety/equipment         General transfer comment: pt is impulsive and not aware of his lines/tubes.   Ambulation/Gait Ambulation/Gait assistance: Min assist;+2 safety/equipment Ambulation Distance (Feet): 75 Feet Assistive device: Rolling walker (2 wheeled) Gait Pattern/deviations: Step-through pattern;Decreased stride length     General Gait Details: leaning posteriorly. assist to support/stabilize pt and maneuver safely with walker. Pt fixated on having to urinate (he has catheter in place)  Stairs            Wheelchair Mobility    Modified Rankin (Stroke Patients Only)       Balance  Overall balance assessment: Needs assistance         Standing balance support: During functional activity;Bilateral upper extremity supported Standing balance-Leahy Scale: Poor                               Pertinent Vitals/Pain Pain Assessment: No/denies pain    Home Living Family/patient expects to be discharged to:: Skilled nursing facility Living Arrangements: Other (Comment) (roommate)                    Prior Function Level of Independence: Needs assistance   Gait / Transfers Assistance Needed: Has been getting around without device but frequent falls per family.  ADL's / Homemaking Assistance Needed: has assist for meals and laundry. Pt is able to feed himself and per family, needs prompting by roommate to do shower regularly. Has assist with shaving.        Hand Dominance        Extremity/Trunk Assessment   Upper Extremity Assessment: Defer to OT evaluation           Lower Extremity Assessment: Generalized weakness      Cervical / Trunk Assessment: Kyphotic  Communication   Communication: No difficulties  Cognition Arousal/Alertness: Awake/alert Behavior During Therapy: WFL for tasks assessed/performed Overall Cognitive Status: History of cognitive impairments - at baseline                      General Comments      Exercises        Assessment/Plan    PT  Assessment Patient needs continued PT services  PT Diagnosis Difficulty walking;Abnormality of gait;Generalized weakness;Altered mental status   PT Problem List Decreased strength;Decreased activity tolerance;Decreased balance;Decreased mobility;Decreased knowledge of use of DME;Decreased safety awareness;Decreased knowledge of precautions  PT Treatment Interventions DME instruction;Gait training;Functional mobility training;Therapeutic activities;Patient/family education;Balance training;Therapeutic exercise   PT Goals (Current goals can be found in the Care Plan  section) Acute Rehab PT Goals Patient Stated Goal: none stated PT Goal Formulation: Patient unable to participate in goal setting Time For Goal Achievement: 09/25/14 Potential to Achieve Goals: Fair    Frequency Min 3X/week   Barriers to discharge        Co-evaluation   Reason for Co-Treatment: For patient/therapist safety   OT goals addressed during session: ADL's and self-care       End of Session Equipment Utilized During Treatment: Gait belt Activity Tolerance: Patient tolerated treatment well Patient left: in bed;with call bell/phone within reach;with nursing/sitter in room           Time: 1157-1216 PT Time Calculation (min) (ACUTE ONLY): 19 min   Charges:   PT Evaluation $Initial PT Evaluation Tier I: 1 Procedure PT Treatments $Gait Training: 8-22 mins   PT G Codes:          Rebeca AlertJannie Elma Shands, MPT Pager: [phone]

## 2014-09-11 NOTE — Plan of Care (Signed)
Problem: Phase II Progression Outcomes Goal: Dressings dry/intact Outcome: Completed/Met Date Met:  09/11/14

## 2014-09-12 LAB — GLUCOSE, CAPILLARY
GLUCOSE-CAPILLARY: 133 mg/dL — AB (ref 70–99)
GLUCOSE-CAPILLARY: 76 mg/dL (ref 70–99)
GLUCOSE-CAPILLARY: 95 mg/dL (ref 70–99)
Glucose-Capillary: 80 mg/dL (ref 70–99)
Glucose-Capillary: 72 mg/dL (ref 70–99)
Glucose-Capillary: 96 mg/dL (ref 70–99)

## 2014-09-12 LAB — CBC
HCT: 36.9 % — ABNORMAL LOW (ref 39.0–52.0)
Hemoglobin: 13.1 g/dL (ref 13.0–17.0)
MCH: 29.9 pg (ref 26.0–34.0)
MCHC: 35.5 g/dL (ref 30.0–36.0)
MCV: 84.2 fL (ref 78.0–100.0)
Platelets: 431 10*3/uL — ABNORMAL HIGH (ref 150–400)
RBC: 4.38 MIL/uL (ref 4.22–5.81)
RDW: 12.2 % (ref 11.5–15.5)
WBC: 11.3 10*3/uL — ABNORMAL HIGH (ref 4.0–10.5)

## 2014-09-12 LAB — BASIC METABOLIC PANEL
Anion gap: 18 — ABNORMAL HIGH (ref 5–15)
BUN: 4 mg/dL — ABNORMAL LOW (ref 6–23)
CO2: 20 mEq/L (ref 19–32)
Calcium: 9 mg/dL (ref 8.4–10.5)
Chloride: 84 mEq/L — ABNORMAL LOW (ref 96–112)
Creatinine, Ser: 0.47 mg/dL — ABNORMAL LOW (ref 0.50–1.35)
GFR calc Af Amer: >90 mL/min (ref >90)
GFR calc non Af Amer: >90 mL/min (ref >90)
Glucose, Bld: 82 mg/dL (ref 70–99)
POTASSIUM: 3.8 meq/L (ref 3.7–5.3)
SODIUM: 122 meq/L — AB (ref 137–147)

## 2014-09-12 LAB — URINE CULTURE
Colony Count: NO GROWTH
Culture: NO GROWTH

## 2014-09-12 MED ORDER — THIAMINE HCL 100 MG/ML IJ SOLN: 100.0000 mg | Freq: Every day | INTRAMUSCULAR | Status: DC

## 2014-09-12 MED ORDER — LORAZEPAM 1 MG PO TABS: 1.0000 mg | ORAL_TABLET | Freq: Four times a day (QID) | ORAL | Status: DC | PRN

## 2014-09-12 MED ORDER — DEXTROSE 5 % IV SOLN
INTRAVENOUS | Status: DC
Start: 2014-09-12 — End: 2014-09-13
  Administered 2014-09-12 – 2014-09-13 (×2): via INTRAVENOUS

## 2014-09-12 MED ORDER — FOLIC ACID 1 MG PO TABS: 1.0000 mg | ORAL_TABLET | Freq: Every day | ORAL | Status: DC

## 2014-09-12 MED ORDER — CEFTRIAXONE SODIUM IN DEXTROSE 20 MG/ML IV SOLN
1.0000 g | INTRAVENOUS | Status: DC
  Administered 2014-09-12 – 2014-09-18 (×7): 1 g via INTRAVENOUS
  Filled 2014-09-12 (×7): qty 50

## 2014-09-12 MED ORDER — ADULT MULTIVITAMIN W/MINERALS CH: 1.0000 | ORAL_TABLET | Freq: Every day | ORAL | Status: DC

## 2014-09-12 MED ORDER — VITAMIN B-1 100 MG PO TABS: 100.0000 mg | ORAL_TABLET | Freq: Every day | ORAL | Status: DC

## 2014-09-12 MED ORDER — LORAZEPAM 2 MG/ML IJ SOLN
1.0000 mg | Freq: Four times a day (QID) | INTRAMUSCULAR | Status: DC | PRN
  Administered 2014-09-12 – 2014-09-14 (×5): 1 mg via INTRAVENOUS
  Filled 2014-09-12 (×5): qty 1

## 2014-09-12 NOTE — Plan of Care (Signed)
Problem: Phase I Progression Outcomes Goal: Vital signs/hemodynamically stable Outcome: Completed/Met Date Met:  09/12/14

## 2014-09-12 NOTE — Plan of Care (Signed)
Problem: Phase I Progression Outcomes Goal: Pain controlled with appropriate interventions Outcome: Completed/Met Date Met:  09/12/14 Goal: Sutures/staples intact Outcome: Completed/Met Date Met:  09/12/14

## 2014-09-12 NOTE — Plan of Care (Signed)
Problem: Phase II Progression Outcomes Goal: Pain controlled Outcome: Completed/Met Date Met:  09/12/14 Goal: Progress activity as tolerated unless otherwise ordered Outcome: Completed/Met Date Met:  09/12/14 Goal: Progressing with IS, TCDB Outcome: Completed/Met Date Met:  09/12/14 Goal: Vital signs stable Outcome: Completed/Met Date Met:  09/12/14

## 2014-09-12 NOTE — Progress Notes (Addendum)
Patient ID: Mark [name], male   DOB: 1950/10/29, 63 y.o.   MRN: [id number]  [facility] PROGRESS NOTE  Mark [name]:[id number] DOB: 03-01-1951 DOA: 09/08/2014 PCP: Kaleen Mask, MD   Brief narrative: 63 y.o. male history of hypertension and alcoholism, previous stroke was brought to the ER after patient was found to have frequent falls. Patient lives with his friend in an apartment and is frequently checked by his sister. Patient's sister stated that patient was recently diagnosed with dementia and is largely dependent on others for his daily living activities. Over the last 1 week patient's friend noted that patient has been having frequent falls but did not lose consciousness. So patient was brought to the ER. In the ER CT of the head did not show anything acute and on exam patient had large right inguinal hernia which was not reducible and CT abdomen and pelvis showed features concerning for obstruction, on-call surgeon was consulted.   Assessment and Plan:    Principal Problem:  Incarcerated inguinal hernia - stable this AM, still confused  - status post incarcerated inguinal hernia repair with mesh, post op day #3 - management per surgery team, appreciate assistance  - diet advanced to clears, will see if pt tolerating well Active Problems:  Hyponatremia - pt provided with IVF since admission, Na is trending down; 127 --> 125 --> 122 - will change IVF to D5 (11/21) - Na is still low but hopefully once pt starts eating this will improve   Hypokalemia - supplemented and WNL this AM  Hypertension - reasonable inpatient control, SBP in 110's this AM  Encephalopathy - possibly secondary to alcohol effects, post op anesthesia, underlying dementia - CT head with evidence of prior strokes, vascular ischemia  - more alert this AM - sitter at bedside   Leucocytosis - WBC trending down, 19.8 --> 13.6 --> 12.1 --> 11.3 - UA checked 11/20 and  suggestive of UTI, CXR with no acute infectious etiology - urine culture pending, place on Rocephin 11/21 - CBC in AM  Alcoholism - will consult once pt able to participate  - keep on CIWA protocol   Severe PCM - advance diet to clear liquid today   Acute functional quadriplegia - PT /OT evaluation done, pt will need SNF upon discharge   DVT prophylaxis  SCD's while pt is in hospital  Code Status: DNR Family Communication: No family at bedside  Disposition Plan: Remains inpatient, SNF Monday    IV Access:    Peripheral IV Diagnostic studies:    Dg Chest 2 View 09/08/2014 No acute cardiopulmonary disease. Old granulomatous disease.   Ct Head Wo Contrast 09/08/2014 Atrophy with small vessel chronic ischemic changes of deep cerebral white matter. 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 No definite acute intracranial abnormalities.   Ct Abdomen Pelvis W Contrast 09/08/2014 RIGHT inguinal hernia containing small bowel loops. Associated dilatation of intra-abdominal small bowel loops compatible with small bowel obstruction secondary to RIGHT inguinal hernia, with some of a small bowel loops proximal to the hernia demonstrating hyperemia and minimal wall thickening suggesting incarceration. No gross evidence of perforation or abscess on exam limited by patient motion.  Medical Consultants:    Surgery  Other Consultants:    Physical therapy/OT Anti-Infectives:    Rocephin 11/21 -->   [name], MD  [facility] Pager [phone]  If 7PM-7AM, please contact night-coverage [email or web address] Password [facility] 09/12/2014, 10:36 AM   LOS: 4 days   HPI/Subjective: No events overnight.  Objective: Filed Vitals:   09/11/14 2034 09/11/14 2347 09/12/14 0024 09/12/14 0614  BP: 156/67 180/104 148/63 139/65  Pulse: 115 107 99 99  Temp: 97.8 F (36.6 C) 97.8 F (36.6 C)  97.8 F (36.6 C)  TempSrc: Oral Oral  Oral  Resp: 19 20  19    Height:      Weight:      SpO2: 100% 98%  98%    Intake/Output Summary (Last 24 hours) at 09/12/14 1036 Last data filed at 09/12/14 [id number]  Gross per 24 hour  Intake 761.67 ml  Output   1525 ml  Net -763.33 ml    Exam:   General:  Pt is alert, follows some commands appropriately, not in acute distress, confused   Cardiovascular: Regular rate and rhythm, S1/S2, no murmurs, no rubs, no gallops  Respiratory: Clear to auscultation bilaterally, diminished breath sounds at bases   Abdomen: Soft, non tender, non distended, bowel sounds present, no guarding  Extremities: pulses DP and PT palpable bilaterally   Data Reviewed: Basic Metabolic Panel:  Recent Labs Lab 09/09/14 1515 09/09/14 1858 09/10/14 1000 09/11/14 0525 09/12/14 0347  NA 127* 125* 127* 125* 122*  K 3.9 3.7 3.3* 3.4* 3.8  CL 95* 92* 94* 89* 84*  CO2 20 22 23 21 20   GLUCOSE 80 77 108* 120* 82  BUN 3* 3* <3* 3* 4*  CREATININE 0.65 0.58 0.54 0.51 0.47*  CALCIUM 8.2* 8.2* 8.3* 8.8 9.0   Liver Function Tests:  Recent Labs Lab 09/08/14 2021 09/11/14 0525  AST 31 27  ALT 13 11  ALKPHOS 63 55  BILITOT 0.7 0.5  PROT 7.5 6.5  ALBUMIN 4.0 3.3*   Recent Labs Lab 09/09/14 0007  AMMONIA 27   CBC:  Recent Labs Lab 09/08/14 2021 09/09/14 0340 09/10/14 1000 09/11/14 0525 09/12/14 0347  WBC 19.8* 13.6* 11.6* 12.1* 11.3*  NEUTROABS 15.2* 10.4*  --   --   --   HGB 13.9 12.6* 11.9* 13.5 13.1  HCT 38.6* 35.1* 33.4* 37.2* 36.9*  MCV 84.8 85.0 85.6 85.3 84.2  PLT 363 355 317 396 431*   Cardiac Enzymes:  Recent Labs Lab 09/08/14 2021  TROPONINI <0.30   BNP: Invalid input(s): POCBNP CBG:  Recent Labs Lab 09/11/14 1620 09/11/14 2007 09/11/14 2338 09/12/14 0352 09/12/14 0745  GLUCAP 71 82 95 80 72    Recent Results (from the past 240 hour(s))  MRSA PCR Screening     Status: None   Collection Time: 09/09/14  3:08 AM  Result Value Ref Range Status   MRSA by PCR NEGATIVE NEGATIVE Final     Comment:        The GeneXpert MRSA Assay (FDA approved for NASAL specimens only), is one component of a comprehensive MRSA colonization surveillance program. It is not intended to diagnose MRSA infection nor to guide or monitor treatment for MRSA infections.      Scheduled Meds: . antiseptic oral rinse  7 mL Mouth Rinse q12n4p  . chlorhexidine  15 mL Mouth Rinse BID  . folic acid  1 mg Oral Daily  . folic acid  1 mg Oral Daily  . multivitamin with minerals  1 tablet Oral Daily  . multivitamin with minerals  1 tablet Oral Daily  . nicotine  21 mg Transdermal Daily  . sodium chloride  3 mL Intravenous Q12H  . thiamine  100 mg Oral Daily   Or  . thiamine  100 mg Intravenous Daily  . thiamine  100 mg  Oral Daily   Or  . thiamine  100 mg Intravenous Daily   Continuous Infusions: . sodium chloride 0.45 % with kcl 50 mL/hr at 09/12/14 [phone]

## 2014-09-13 LAB — BASIC METABOLIC PANEL
Anion gap: 14 (ref 5–15)
BUN: 7 mg/dL (ref 6–23)
CO2: 20 mEq/L (ref 19–32)
Calcium: 8 mg/dL — ABNORMAL LOW (ref 8.4–10.5)
Chloride: 85 mEq/L — ABNORMAL LOW (ref 96–112)
Creatinine, Ser: 0.54 mg/dL (ref 0.50–1.35)
GFR calc non Af Amer: >90 mL/min (ref >90)
Glucose, Bld: 105 mg/dL — ABNORMAL HIGH (ref 70–99)
POTASSIUM: 3.9 meq/L (ref 3.7–5.3)
Sodium: 119 mEq/L — CL (ref 137–147)

## 2014-09-13 LAB — GLUCOSE, CAPILLARY
GLUCOSE-CAPILLARY: 112 mg/dL — AB (ref 70–99)
Glucose-Capillary: 102 mg/dL — ABNORMAL HIGH (ref 70–99)
Glucose-Capillary: 97 mg/dL (ref 70–99)
Glucose-Capillary: 105 mg/dL — ABNORMAL HIGH (ref 70–99)
Glucose-Capillary: 129 mg/dL — ABNORMAL HIGH (ref 70–99)
Glucose-Capillary: 103 mg/dL — ABNORMAL HIGH (ref 70–99)

## 2014-09-13 LAB — CBC
HCT: 36.5 % — ABNORMAL LOW (ref 39.0–52.0)
Hemoglobin: 13.1 g/dL (ref 13.0–17.0)
MCH: 30 pg (ref 26.0–34.0)
MCHC: 35.9 g/dL (ref 30.0–36.0)
MCV: 83.7 fL (ref 78.0–100.0)
Platelets: 416 10*3/uL — ABNORMAL HIGH (ref 150–400)
RBC: 4.36 MIL/uL (ref 4.22–5.81)
RDW: 12.2 % (ref 11.5–15.5)
WBC: 9.1 10*3/uL (ref 4.0–10.5)

## 2014-09-13 LAB — URINE CULTURE
CULTURE: NO GROWTH
Colony Count: NO GROWTH

## 2014-09-13 MED ORDER — ONDANSETRON HCL 4 MG PO TABS: 4.0000 mg | ORAL_TABLET | Freq: Four times a day (QID) | ORAL | Status: DC | PRN

## 2014-09-13 MED ORDER — HYDRALAZINE HCL 20 MG/ML IJ SOLN: 10.0000 mg | Freq: Three times a day (TID) | INTRAMUSCULAR | Status: DC

## 2014-09-13 MED ORDER — HYDRALAZINE HCL 10 MG PO TABS: 10.0000 mg | ORAL_TABLET | Freq: Three times a day (TID) | ORAL | Status: DC

## 2014-09-13 MED ORDER — HYDRALAZINE HCL 10 MG PO TABS
10.0000 mg | ORAL_TABLET | Freq: Three times a day (TID) | ORAL | Status: DC
  Administered 2014-09-13 – 2014-09-15 (×6): 10 mg via ORAL
  Filled 2014-09-13 (×25): qty 1

## 2014-09-13 NOTE — Plan of Care (Signed)
Problem: Phase II Progression Outcomes Goal: Surgical site without signs of infection Outcome: Completed/Met Date Met:  09/13/14     

## 2014-09-13 NOTE — Progress Notes (Addendum)
  CRITICAL VALUE ALERT  Critical value received:  Na+119  Date of notification:  09/13/14  Time of notification:  0500  Critical value read back:Yes.    Nurse who received alert:  Mercer PodLUTTERLOH, KIMBERLY D    MD notified (1st page):  Lenny Pastelom Callahan NP   Time of first page:  0505  MD notified (2nd page):  Time of second page:  Responding MD:  Lenny Pastelom Callahan NP  Time MD responded:  [phone]

## 2014-09-13 NOTE — Discharge Instructions (Signed)
[facility] _______[facility] ° °UMBILICAL OR INGUINAL HERNIA REPAIR: POST OP INSTRUCTIONS ° °Always review your discharge instruction sheet given to you by the facility where your surgery was performed. °IF YOU HAVE DISABILITY OR FAMILY LEAVE FORMS, YOU MUST BRING THEM TO THE OFFICE FOR PROCESSING.   °DO NOT GIVE THEM TO YOUR DOCTOR. ° °1. A  prescription for pain medication may be given to you upon discharge.  Take your pain medication as prescribed, if needed.  If narcotic pain medicine is not needed, then you may take acetaminophen (Tylenol) or ibuprofen (Advil) as needed. °2. Take your usually prescribed medications unless otherwise directed. °3. If you need a refill on your pain medication, please contact your pharmacy.  They will contact our office to request authorization. Prescriptions will not be filled after 5 pm or on week-ends. °4. You should follow a light diet the first 24 hours after arrival home, such as soup and crackers, etc.  Be sure to include lots of fluids daily.  Resume your normal diet the day after surgery. °5. Most patients will experience some swelling and bruising around the umbilicus or in the groin and scrotum.  Ice packs and reclining will help.  Swelling and bruising can take several days to resolve.  °6. It is common to experience some constipation if taking pain medication after surgery.  Increasing fluid intake and taking a stool softener (such as Colace) will usually help or prevent this problem from occurring.  A mild laxative (Milk of Magnesia or Miralax) should be taken according to package directions if there are no bowel movements after 48 hours. °7. Unless discharge instructions indicate otherwise, you may remove your bandages 24-48 hours after surgery, and you may shower at that time.  You may have steri-strips (small skin tapes) in place directly over the incision.  These strips should be left on the skin for 7-10 days.  If your surgeon used skin glue on the  incision, you may shower in 24 hours.  The glue will flake off over the next 2-3 weeks.  Any sutures or staples will be removed at the office during your follow-up visit. °8. ACTIVITIES:  You may resume regular (light) daily activities beginning the next day--such as daily self-care, walking, climbing stairs--gradually increasing activities as tolerated.  You may have sexual intercourse when it is comfortable.  Refrain from any heavy lifting or straining until approved by your doctor. °a. You may drive when you are no longer taking prescription pain medication, you can comfortably wear a seatbelt, and you can safely maneuver your car and apply brakes. °b. RETURN TO WORK:  __________________________________________________________ °9. You should see your doctor in the office for a follow-up appointment approximately 2-3 weeks after your surgery.  Make sure that you call for this appointment within a day or two after you arrive home to insure a convenient appointment time. °10. OTHER INSTRUCTIONS:  __________________________________________________________________________________________________________________________________________________________________________________________  °WHEN TO CALL YOUR DOCTOR: °1. Fever over 101.0 °2. Inability to urinate °3. Nausea and/or vomiting °4. Extreme swelling or bruising °5. Continued bleeding from incision. °6. Increased pain, redness, or drainage from the incision ° °The clinic staff is available to answer your questions during regular business hours.  Please don’t hesitate to call and ask to speak to one of the nurses for clinical concerns.  If you have a medical emergency, go to the nearest emergency room or call 911.  A surgeon from [facility] is always on call at the hospital ° ° °  [address], [address], [place], [place]  [id number] ?  [address] [address], [place], [place]   [id number] [phone] ? [phone] ? FAX [phone] Web site:  [email or web address]  Hernia A hernia occurs when an internal organ pushes out through a weak spot in the abdominal wall. Hernias most commonly occur in the groin and around the navel. Hernias often can be pushed back into place (reduced). Most hernias tend to get worse over time. Some abdominal hernias can get stuck in the opening (irreducible or incarcerated hernia) and cannot be reduced. An irreducible abdominal hernia which is tightly squeezed into the opening is at risk for impaired blood supply (strangulated hernia). A strangulated hernia is a medical emergency. Because of the risk for an irreducible or strangulated hernia, surgery may be recommended to repair a hernia. CAUSES   Heavy lifting.  Prolonged coughing.  Straining to have a bowel movement.  A cut (incision) made during an abdominal surgery. HOME CARE INSTRUCTIONS   Bed rest is not required. You may continue your normal activities.  Avoid lifting more than 10 pounds (4.5 kg) or straining.  Cough gently. If you are a smoker it is best to stop. Even the best hernia repair can break down with the continual strain of coughing. Even if you do not have your hernia repaired, a cough will continue to aggravate the problem.  Do not wear anything tight over your hernia. Do not try to keep it in with an outside bandage or truss. These can damage abdominal contents if they are trapped within the hernia sac.  Eat a normal diet.  Avoid constipation. Straining over long periods of time will increase hernia size and encourage breakdown of repairs. If you cannot do this with diet alone, stool softeners may be used. SEEK IMMEDIATE MEDICAL CARE IF:   You have a fever.  You develop increasing abdominal pain.  You feel nauseous or vomit.  Your hernia is stuck outside the abdomen, looks discolored, feels hard, or is tender.  You have any changes in your bowel habits or in the hernia that are unusual for you.  You have  increased pain or swelling around the hernia.  You cannot push the hernia back in place by applying gentle pressure while lying down. MAKE SURE YOU:   Understand these instructions.  Will watch your condition.  Will get help right away if you are not doing well or get worse. Document Released: 10/09/2005 Document Revised: 01/01/2012 Document Reviewed: 05/28/2008 [facility] [employer]Care Patient Information 2015 [employer] [place]. This information is not intended to replace advice given to you by your health care provider. Make sure you discuss any questions you have with your health care provider.

## 2014-09-13 NOTE — Progress Notes (Signed)
Spoke with pt's sister, Trula SladeJudy Atkinson, re: pt's bed offers.  Offers given to her over the phone with a hard copy left at bedside.  Unit CSW will continue to follow for placement.

## 2014-09-13 NOTE — Discharge Summary (Addendum)
Physician Discharge Summary  Mark BilberryMichael [name] ZOX:[id number] DOB: Feb 11, 1951 DOA: 09/08/2014  PCP: [name],[name], MD  Admit date: 09/08/2014 Discharge date: 09/14/2014  Recommendations for Outpatient Follow-up:  1. Pt will need to follow up with PCP in 2-3 weeks post discharge 2. Please obtain BMP to evaluate electrolytes and kidney function, sodium level  3. Please also check CBC to evaluate Hg and Hct levels 4. Please also note that pt has completed 3 days treatment for uncomplicated UTI, no need for ABX upon discharge  5. Please note that pt needs to see Dr. [name], surgery specialist for removal of sutures 09/25/2014 at 10 am (noted below) 6. Summary routed to PCP to schedule follow up appointment   Discharge Diagnoses: Incarcerated hernia  Principal Problem:   Incarcerated inguinal hernia Active Problems:   Hyponatremia   Hypertension   Encephalopathy   Leucocytosis   Alcoholism   Protein-calorie malnutrition, severe   Discharge Condition: Stable  Diet recommendation: Heart healthy diet discussed in details   Brief narrative: 63 y.o. male history of hypertension and alcoholism, previous stroke was brought to the ER after patient was found to have frequent falls. Patient lives with his friend in an apartment and is frequently checked by his sister. Patient's sister stated that patient was recently diagnosed with dementia and is largely dependent on others for his daily living activities. Over the last 1 week patient's friend noted that patient has been having frequent falls but did not lose consciousness. So patient was brought to the ER. In the ER CT of the head did not show anything acute and on exam patient had large right inguinal hernia which was not reducible and CT abdomen and pelvis showed features concerning for obstruction, on-call surgeon was consulted.   Pt underwent inguinal hernia repair by Dr. [name] 09/09/2014. Slow improvement in mental status  complicated by underlying vascular and alcohol induced dementia. Pt more alert morning 09/13/2014 and requesting to advance diet as he is more hungry. If pt stable in AM and tolerating diet well, along with improving sodium level, pt could be discharged to SNF. Family agrees with SNF placement   Assessment and Plan:    Principal Problem:  Incarcerated inguinal hernia - stable this AM, more alert and requesting to advance diet  - status post incarcerated inguinal hernia repair with mesh, post op day #4 - per surgery team, OK to advance diet  - continue to clean site with soap and water, no need for dressing, keep surgical area dry and clean - encourage ambulation - follow up appointment for staple removal scheduled and placed in AVS  - no lifting over 10 lbs recommended  Active Problems:  Hyponatremia - pt provided with IVF since admission, Na is trending down; 127 --> 125 --> 122 --> 119 - changed IVF from NS to D5 (11/21), stop IVF today and repeat BMP in AM - Na is still low but hopefully once pt starts eating this will improve  - if sodium trending up, pt should be stable for d/c   Hypokalemia - supplemented and WNL this AM  Hypertension - SBP in 150's this AM - placed on hydralazine scheduled 10 mg TID and will monitor clinical response   Encephalopathy - possibly secondary to alcohol effects, post op anesthesia, underlying dementia - CT head with evidence of prior strokes, vascular ischemia  - more alert this AM - no sitter at bedside, encourage ambulation - PT/OT done, recommend SNF and family agrees with placement   Leucocytosis -  WBC trending down, 19.8 --> 13.6 --> 12.1 --> 11.3--> 9.1 - UA checked 11/20 and suggestive of UTI, CXR with no acute infectious etiology - urine culture with no growth, placed on Rocephin 11/21, continue rocephin day #2/3 - pt to receive one more dose of Rocephin in AM and that should be adequate for tx of an uncomplicated  UTI  Alcoholism - on CIWA protocol  - has not received Ativan since last night and pt is stable - minimal tremor on exam this AM   Severe PCM - advance diet to soft this AM   Acute functional quadriplegia - PT /OT evaluation done, pt will need SNF upon discharge   DVT prophylaxis  SCD's while pt is in hospital, stop upon discharge   Code Status: DNR Family Communication: No family at bedside  Disposition Plan: Remains inpatient, SNF Monday    IV Access:    Peripheral IV Diagnostic studies:    Dg Chest 2 View 09/08/2014 No acute cardiopulmonary disease. Old granulomatous disease.   Ct Head Wo Contrast 09/08/2014 Atrophy with small vessel chronic ischemic changes of deep cerebral white matter. 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 No definite acute intracranial abnormalities.   Ct Abdomen Pelvis W Contrast 09/08/2014 RIGHT inguinal hernia containing small bowel loops. Associated dilatation of intra-abdominal small bowel loops compatible with small bowel obstruction secondary to RIGHT inguinal hernia, with some of a small bowel loops proximal to the hernia demonstrating hyperemia and minimal wall thickening suggesting incarceration. No gross evidence of perforation or abscess on exam limited by patient motion.  Medical Consultants:    Surgery  Other Consultants:    Physical therapy/OT Anti-Infectives:    Rocephin 11/21 --> 09/14/2014    Discharge Exam: Filed Vitals:   09/13/14 1354  BP: 153/91  Pulse: 78  Temp: 97.4 F (36.3 C)  Resp: 18   Filed Vitals:   09/12/14 2042 09/13/14 0414 09/13/14 1300 09/13/14 1354  BP: 142/71 141/87  153/91  Pulse: 101 83  78  Temp: 97.5 F (36.4 C) 97.8 F (36.6 C)  97.4 F (36.3 C)  TempSrc: Oral Axillary  Oral  Resp: 19 18  18   Height:      Weight:  47.6 kg (104 lb 15 oz)    SpO2: 99% 100% 100% 100%    General: Pt is alert, follows commands appropriately, not  in acute distress Cardiovascular: Regular rate and rhythm, S1/S2 +, no murmurs, no rubs, no gallops Respiratory: Clear to auscultation bilaterally, no wheezing, diminished breath sounds at bases  Abdominal: Soft, non tender, non distended, bowel sounds +, no guarding Extremities:tracebilateral LE pitting edema, no cyanosis, pulses palpable bilaterally DP and PT  Discharge Instructions      Medication List    TAKE these medications        hydrALAZINE 10 MG tablet  Commonly known as:  APRESOLINE  Take 1 tablet (10 mg total) by mouth every 8 (eight) hours.     lisinopril 10 MG tablet  Commonly known as:  PRINIVIL,ZESTRIL  Take 10 mg by mouth daily.     ondansetron 4 MG tablet  Commonly known as:  ZOFRAN  Take 1 tablet (4 mg total) by mouth every 6 (six) hours as needed for nausea.          Medication List    TAKE these medications        hydrALAZINE 20 MG/ML injection  Commonly known as:  APRESOLINE  Inject 0.5 mLs (10 mg total) into  the vein 3 (three) times daily.     lisinopril 10 MG tablet  Commonly known as:  PRINIVIL,ZESTRIL  Take 10 mg by mouth daily.     ondansetron 4 MG tablet  Commonly known as:  ZOFRAN  Take 1 tablet (4 mg total) by mouth every 6 (six) hours as needed for nausea.           Follow-up Information    Follow up with Mariella Saa, MD On 09/25/2014.   Specialty:  General Surgery   Why:  Be at the office at 10 AM for check in your appointment is at 10:30 AM.   Contact information:   [address] [place] [address] [phone]       Follow up with Kaleen Mask, MD.   Specialty:  [facility]   Contact information:   [address] [place] [address] [phone]        The results of significant diagnostics from this hospitalization (including imaging, microbiology, ancillary and laboratory) are listed below for reference.     Microbiology: Recent Results (from the past 240 hour(s))   MRSA PCR Screening     Status: None   Collection Time: 09/09/14  3:08 AM  Result Value Ref Range Status   MRSA by PCR NEGATIVE NEGATIVE Final    Comment:        The GeneXpert MRSA Assay (FDA approved for NASAL specimens only), is one component of a comprehensive MRSA colonization surveillance program. It is not intended to diagnose MRSA infection nor to guide or monitor treatment for MRSA infections.   Urine culture     Status: None   Collection Time: 09/11/14  9:04 AM  Result Value Ref Range Status   Specimen Description URINE, RANDOM  Final   Special Requests NONE  Final   Culture  Setup Time   Final    09/11/2014 11:48 Performed at [facility]    Colony Count NO GROWTH Performed at [facility]   Final   Culture NO GROWTH Performed at [facility]   Final   Report Status 09/12/2014 FINAL  Final     Labs: Basic Metabolic Panel:  Recent Labs Lab 09/09/14 1858 09/10/14 1000 09/11/14 0525 09/12/14 0347 09/13/14 0410  NA 125* 127* 125* 122* 119*  K 3.7 3.3* 3.4* 3.8 3.9  CL 92* 94* 89* 84* 85*  CO2 22 23 21 20 20   GLUCOSE 77 108* 120* 82 105*  BUN 3* <3* 3* 4* 7  CREATININE 0.58 0.54 0.51 0.47* 0.54  CALCIUM 8.2* 8.3* 8.8 9.0 8.0*   Liver Function Tests:  Recent Labs Lab 09/08/14 2021 09/11/14 0525  AST 31 27  ALT 13 11  ALKPHOS 63 55  BILITOT 0.7 0.5  PROT 7.5 6.5  ALBUMIN 4.0 3.3*   No results for input(s): LIPASE, AMYLASE in the last 168 hours.  Recent Labs Lab 09/09/14 0007  AMMONIA 27   CBC:  Recent Labs Lab 09/08/14 2021 09/09/14 0340 09/10/14 1000 09/11/14 0525 09/12/14 0347 09/13/14 0410  WBC 19.8* 13.6* 11.6* 12.1* 11.3* 9.1  NEUTROABS 15.2* 10.4*  --   --   --   --   HGB 13.9 12.6* 11.9* 13.5 13.1 13.1  HCT 38.6* 35.1* 33.4* 37.2* 36.9* 36.5*  MCV 84.8 85.0 85.6 85.3 84.2 83.7  PLT 363 355 317 396 431* 416*   Cardiac Enzymes:  Recent Labs Lab 09/08/14 2021  TROPONINI <0.30   BNP: BNP  (last 3  results) No results for input(s): PROBNP in the last 8760 hours. CBG:  Recent Labs Lab 09/12/14 2039 09/12/14 2354 09/13/14 0410 09/13/14 0726 09/13/14 1120  GLUCAP 96 95 102* 97 105*     SIGNED: Time coordinating discharge: Over 30 minutes  Debbora PrestoMAGICK-Goerge Mohr, MD  [facility] 09/13/2014, 2:11 PM Pager [phone]  If 7PM-7AM, please contact night-coverage [email or web address] Password [email or web address]

## 2014-09-14 ENCOUNTER — Inpatient Hospital Stay (HOSPITAL_COMMUNITY): Payer: Medicare Other

## 2014-09-14 DIAGNOSIS — E43 Unspecified severe protein-calorie malnutrition: Secondary | ICD-10-CM

## 2014-09-14 LAB — SODIUM, URINE, RANDOM: SODIUM UR: 89 meq/L

## 2014-09-14 LAB — BASIC METABOLIC PANEL
ANION GAP: 13 (ref 5–15)
Anion gap: 16 — ABNORMAL HIGH (ref 5–15)
Anion gap: 13 (ref 5–15)
BUN: 6 mg/dL (ref 6–23)
BUN: 5 mg/dL — ABNORMAL LOW (ref 6–23)
BUN: 7 mg/dL (ref 6–23)
CALCIUM: 8.6 mg/dL (ref 8.4–10.5)
CALCIUM: 8.9 mg/dL (ref 8.4–10.5)
CHLORIDE: 82 meq/L — AB (ref 96–112)
CO2: 21 mEq/L (ref 19–32)
CO2: 21 meq/L (ref 19–32)
CO2: 21 mEq/L (ref 19–32)
CREATININE: 0.51 mg/dL (ref 0.50–1.35)
CREATININE: 0.55 mg/dL (ref 0.50–1.35)
Calcium: 8.5 mg/dL (ref 8.4–10.5)
Chloride: 83 mEq/L — ABNORMAL LOW (ref 96–112)
Chloride: 82 mEq/L — ABNORMAL LOW (ref 96–112)
Creatinine, Ser: 0.54 mg/dL (ref 0.50–1.35)
GFR calc Af Amer: >90 mL/min (ref >90)
GFR calc Af Amer: >90 mL/min (ref >90)
GFR calc non Af Amer: >90 mL/min (ref >90)
GFR calc non Af Amer: >90 mL/min (ref >90)
GLUCOSE: 128 mg/dL — AB (ref 70–99)
GLUCOSE: 104 mg/dL — AB (ref 70–99)
Glucose, Bld: 94 mg/dL (ref 70–99)
POTASSIUM: 4.2 meq/L (ref 3.7–5.3)
Potassium: 4 mEq/L (ref 3.7–5.3)
Potassium: 4.7 mEq/L (ref 3.7–5.3)
Sodium: 119 mEq/L — CL (ref 137–147)
Sodium: 117 mEq/L — CL (ref 137–147)
Sodium: 116 mEq/L — CL (ref 137–147)

## 2014-09-14 LAB — GLUCOSE, CAPILLARY
GLUCOSE-CAPILLARY: 92 mg/dL (ref 70–99)
Glucose-Capillary: 106 mg/dL — ABNORMAL HIGH (ref 70–99)
Glucose-Capillary: 125 mg/dL — ABNORMAL HIGH (ref 70–99)
Glucose-Capillary: 102 mg/dL — ABNORMAL HIGH (ref 70–99)

## 2014-09-14 LAB — OSMOLALITY, URINE: Osmolality, Ur: 479 mOsm/kg (ref 390–1090)

## 2014-09-14 LAB — OSMOLALITY: OSMOLALITY: 243 mosm/kg — AB (ref 275–300)

## 2014-09-14 MED ORDER — LORAZEPAM 2 MG/ML IJ SOLN
INTRAMUSCULAR | Status: AC
  Filled 2014-09-14: qty 1

## 2014-09-14 MED ORDER — LORAZEPAM 2 MG/ML IJ SOLN
0.2500 mg | Freq: Once | INTRAMUSCULAR | Status: AC
  Administered 2014-09-14: 0.25 mg via INTRAVENOUS

## 2014-09-14 MED ORDER — ENSURE COMPLETE PO LIQD
237.0000 mL | Freq: Two times a day (BID) | ORAL | Status: DC
  Administered 2014-09-14 (×2): 237 mL via ORAL

## 2014-09-14 MED ORDER — LORAZEPAM 0.5 MG PO TABS: 0.5000 mg | ORAL_TABLET | Freq: Once | ORAL | Status: DC

## 2014-09-14 MED ORDER — ENSURE PUDDING PO PUDG
1.0000 | Freq: Three times a day (TID) | ORAL | Status: DC
  Administered 2014-09-15 – 2014-09-17 (×2): 1 via ORAL
  Filled 2014-09-14 (×22): qty 1

## 2014-09-14 NOTE — Progress Notes (Signed)
CSW confirmed with patient's sister, Darel HongJudy that they have accepted bed at [facility] when ready for discharge. CSW left message for [place] at [facility] to make them aware.   Clinical Social Work Department CLINICAL SOCIAL WORK PLACEMENT NOTE 09/14/2014  Patient:  Mark [name],Mark [name]  Account Number:  [id number] Admit date:  09/08/2014  Clinical Social Worker:  Orpah GreekKELLY FOLEY, LCSWA  Date/time:  09/11/2014 03:18 PM  Clinical Social Work is seeking post-discharge placement for this patient at the following level of care:   SKILLED NURSING   (*CSW will update this form in Epic as items are completed)   09/11/2014  Patient/family provided with [facility] Department of Clinical Social Work's list of facilities offering this level of care within the geographic area requested by the patient (or if unable, by the patient's family).  09/11/2014  Patient/family informed of their freedom to choose among providers that offer the needed level of care, that participate in Medicare, Medicaid or managed care program needed by the patient, have an available bed and are willing to accept the patient.  09/11/2014  Patient/family informed of [facility]' ownership interest in [facility], as well as of the fact that they are under no obligation to receive care at this facility.  PASARR submitted to EDS on 09/11/2014 PASARR number received on 09/11/2014  FL2 transmitted to all facilities in geographic area requested by pt/family on  09/11/2014 FL2 transmitted to all facilities within larger geographic area on   Patient informed that his/her managed care company has contracts with or will negotiate with  certain facilities, including the following:     Patient/family informed of bed offers received:  09/13/2014 [facility] Physician recommends and patient chooses bed at    [facility] on   Patient to  be transferred to facility by  Patient and family notified of transfer on  Name of family member notified:    The following physician request were entered in Epic:   Additional Comments:   Lincoln MaxinKelly Johntavious Francom, LCSW [facility] Clinical Social Worker cell #: [phone]

## 2014-09-14 NOTE — Progress Notes (Signed)
Pt becoming more restless and continues to have some confusion.  Reorientation does not last very long. Pulling on tubes.  VSS.  Gave Ativan IV.  Will continue to monitor.  Bed alarm is on.

## 2014-09-14 NOTE — Progress Notes (Signed)
Physical Therapy Treatment Patient Details Name: Mark [name] MRN: [id number] DOB: [birth year] Today's Date: 09/14/2014    History of Present Illness Mark [name] is a 63 y.o. male history of hypertension and alcoholism and previous stroke was brought to the ER after patient was found to have frequent  falls. Pt is s/p HERNIA REPAIR INGUINAL INCARCERATED with mesh on 09/09/14    PT Comments    Pt continues to require +2 assist for mobility. Very unsteady and a high fall risk with OOB activity. Continue to recommend SNF  Follow Up Recommendations  SNF;Supervision/Assistance - 24 hour     Equipment Recommendations  Rolling walker with 5" wheels    Recommendations for Other Services OT consult     Precautions / Restrictions Precautions Precautions: Fall Restrictions Weight Bearing Restrictions: No    Mobility  Bed Mobility Overal bed mobility: Needs Assistance Bed Mobility: Supine to Sit;Sit to Supine     Supine to sit: Mod assist Sit to supine: Min assist   General bed mobility comments: max encouragement for participation. Assist for trunk to upright. Pt initially requiring Min assist to maintain sitting balance.   Transfers Overall transfer level: Needs assistance Equipment used: Rolling walker (2 wheeled) Transfers: Sit to/from Stand Sit to Stand: Min assist;+2 physical assistance;+2 safety/equipment         General transfer comment: Assist to rise, stabilize, control descent. Multimodal cues for safety, technique, hand placement  Ambulation/Gait Ambulation/Gait assistance: Min assist;+2 safety/equipment;+2 physical assistance Ambulation Distance (Feet): 100 Feet Assistive device: Rolling walker (2 wheeled) Gait Pattern/deviations: Step-through pattern;Decreased stride length;Drifts right/left;Narrow base of support     General Gait Details: leaning posteriorly and to L side. assist to support/stabilize pt and maneuver safely with walker.     Stairs            Wheelchair Mobility    Modified Rankin (Stroke Patients Only)       Balance   Sitting-balance support: No upper extremity supported;Feet unsupported Sitting balance-Leahy Scale: Poor     Standing balance support: Bilateral upper extremity supported;During functional activity Standing balance-Leahy Scale: Poor                      Cognition Arousal/Alertness: Awake/alert Behavior During Therapy: WFL for tasks assessed/performed Overall Cognitive Status: History of cognitive impairments - at baseline                      Exercises      General Comments        Pertinent Vitals/Pain Pain Assessment: No/denies pain    Home Living                      Prior Function            PT Goals (current goals can now be found in the care plan section) Progress towards PT goals: Progressing toward goals    Frequency  Min 3X/week    PT Plan Current plan remains appropriate    Co-evaluation             End of Session Equipment Utilized During Treatment: Gait belt Activity Tolerance: Patient tolerated treatment well Patient left: in bed;with call bell/phone within reach;with bed alarm set     Time: [id number] PT Time Calculation (min) (ACUTE ONLY): 15 min  Charges:  $Gait Training: 8-22 mins                    G  Codes:      Weston Anna, MPT Pager: [phone]

## 2014-09-14 NOTE — Progress Notes (Signed)
CARE MANAGEMENT NOTE 09/14/2014  Patient:  Mark [name],Mark [name]   Account Number:  [id number]  Date Initiated:  09/09/2014  Documentation initiated by:  DAVIS,RHONDA  Subjective/Objective Assessment:   HERNIA REPAIR INGUINAL INCARCERATED with mesh     Action/Plan:   home when stable   Anticipated DC Date:  09/16/2014   Anticipated DC Plan:  SKILLED NURSING FACILITY  In-house referral  NA      DC Planning Services  CM consult      PAC Choice  NA   Choice offered to / List presented to:  NA   DME arranged  NA      DME agency  NA     HH arranged  NA      HH agency  NA   Status of service:  In process, will continue to follow Medicare Important Message given?  YES (If response is "NO", the following Medicare IM given date fields will be blank) Date Medicare IM given:  09/11/2014 Medicare IM given by:  [facility]HAB[facility],Ellise Kovack Date Additional Medicare IM given:  09/14/2014 Additional Medicare IM given by:  [facility]  Discharge Disposition:    Per UR Regulation:  Reviewed for med. necessity/level of care/duration of stay  If discussed at Long Length of Stay Meetings, dates discussed:    Comments:  09/14/14 Brady Plant RN,BSN NCM [phone] HYPONATREMIA-FLUID RESTRICTION.D/C PLAN SNF.  09/11/14 Jaylea Plourde RN,BSN NCM [phone] 1:1 SITTER-D/C SNF.  [id number]/Rhonda Earlene Plateravis, RN, BSN, CCM Chart reviewed. Discharge needs and patient's stay to be reviewed and followed by case manager. Chart note for progression of stay: unresponsive this Am, Assessment/Plan Incarcerated right inguinal hernia HERNIA REPAIR INGUINAL INCARCERATED with mesh, [place]worth, Lorne SkeensBenjamin T, MD.09/09/14 Frequent falls/AMS/encephalopathy/Hyponatremia/Hx of dementia /Hx of alcoholism/Hypertension  SCD for DVT/currently Plan:  hydratedtoday, and if alert and stable we can start liquids in AM.

## 2014-09-14 NOTE — Progress Notes (Addendum)
09/14/14 1842  What Happened  Was fall witnessed? No  Was patient injured? Unsure  Patient found on floor  Found by Staff-comment  Stated prior activity other (comment) (sitting in bed eating dinner)  Follow Up  MD notified Dr. Gonzella Lexhungel  Time MD notified [phone]  Family notified Yes-comment Mark Planas(Mark [name])  Time family notified [phone]  Additional tests Yes-comment  Progress note created (see row info) Yes  Adult Fall Risk Assessment  Risk Factor Category (scoring not indicated) History of more than one fall within 6 months before admission (document High fall risk)  Patient's Fall Risk High Fall Risk (>13 points)  Adult Fall Risk Interventions  Required Bundle Interventions *See Row Information* High fall risk - low, moderate, and high requirements implemented  Additional Interventions Fall risk signage;Individualized elimination schedule;PT/OT need assessed if change in mobility from baseline;Reorient/diversional activities with confused patients;Room near nurses station;Secure all tubes/drains;Use of appropriate toileting equipment (bedpan, BSC, etc.)  Vitals  Temp 98.9 F (37.2 C)  Temp Source Oral  BP (!) 175/85 mmHg  BP Location Right Leg  BP Method Automatic  Patient Position (if appropriate) Sitting  Pulse Rate 80  Resp 18  Oxygen Therapy  SpO2 100 %  O2 Device Room Air  Pain Assessment  Pain Assessment No/denies pain  PCA/Epidural/Spinal Assessment  Respiratory Pattern Regular  Neurological  Neuro (WDL) X  Level of Consciousness Alert  Orientation Level Disoriented to situation;Disoriented to place;Disoriented to time;Oriented to person  Cognition Impulsive;Poor attention/concentration;Follows commands;Poor judgement;Poor Agricultural consultantsafety awareness  Speech Slurred/Dysarthria  Pupil Assessment  Yes  R Pupil Size (mm) 3  R Pupil Shape Round  R Pupil Reaction Brisk  L Pupil Size (mm) 3  L Pupil Shape Round  L Pupil Reaction Brisk  Additional Pupil Assessments No  Motor  Function/Sensation Assessment Grip;Elbow extension;Elbow flexion;Motor response;Sensation  Facial Symmetry Symmetrical  R Hand Grip Strong;Present  L Hand Grip Strong;Present  R Elbow Extension (Push/Biceps) Present  L Elbow Extension (Push/Biceps) Present  R Elbow Flexion (Pull/Triceps) Present  L Elbow Flexion (Pull/Triceps) Present  Right Pronator Drift Absent  Left Pronator Drift Absent  R Foot Dorsiflexion Present;Strong  L Foot Dorsiflexion Present;Strong  R Foot Plantar Flexion Present;Strong  L Foot Plantar Flexion Present;Strong  RUE Motor Response Purposeful movement;No tremor  RUE Sensation Full sensation  LUE Motor Response Purposeful movement;No tremor  LUE Sensation Full sensation  RLE Motor Response Purposeful movement;No tremor  RLE Sensation Full sensation  LLE Motor Response Purposeful movement;No tremor  LLE Sensation Full sensation  Neuro Symptoms Anxiety;Forgetful  Seizure Activity  Psychomotor Symptoms None  Reflexes  Gag Present  Cough Present  Glasgow Coma Scale  Eye Opening 4  Best Motor Response 6  Glasgow Coma Scale Score 10  Musculoskeletal  Musculoskeletal (WDL) X  Assistive Device BSC;Front wheel walker  Generalized Weakness Yes  Weight Bearing Restrictions No  Integumentary  Integumentary (WDL) X  Skin Color Appropriate for ethnicity  Skin Condition Dry  Skin Integrity Skin tear  Skin Tear Location Arm;Elbow;Knee  Skin Tear Location Orientation Bilateral  Skin Tear Intervention Foam  Pt found with his legs and feet in the bed and  Right side of head and right shoulder laying on the floor . Red area noted on right side of head and right shoulder.  Pt denies pain. When ask what pt was trying to do before the fall, pt stated" I was getting my drink."  Dr Gonzella Lexhungel notified and examined pt at bedside. CT scan of head ordered. Pt brother  Mark [name] notified of fall. Will continue to monitor.

## 2014-09-14 NOTE — Progress Notes (Signed)
CRITICAL VALUE ALERT  Critical value received:  Sodium 117    Date of notification:  09/14/14  Time of notification:  1237  Critical value read back:Yes.    Nurse who received alert:  Mignon PineJenny Burns RN  MD notified (1st page):  Dr. Gonzella Lexhungel   Time of first page:  1240  MD notified (2nd page):  Time of second page:  Responding MD:  Dr. Windy Cannyunghel      Time MD responded:  1245

## 2014-09-14 NOTE — Progress Notes (Signed)
[facility] PROGRESS NOTE  Auburn BilberryMichael Salberg QMV:[id number] DOB: 21-Apr-1951 DOA: 09/08/2014 PCP: [name],[name], MD  Assessment/Plan: Principal Problem:  Incarcerated inguinal hernia - status post  repair with mesh, post op day #5 - per surgery team, OK to advance diet  - continue to clean site with soap and water, no need for dressing, keep surgical area dry and clean - encourage ambulation - follow up with sx as outpt  Active Problems:  Hyponatremia Progressive drop in Na level. normal repeat urine osm and urine Na level. Serum osm pending. 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 Restrict fluid to 800 cc/24 hrs and monitor Na closely. Monitor on telemetry Renal consult in Na unimproved or worsened   Hypokalemia - supplemented    Hypertension -stable. Prn hydralazine   Encephalopathy Possibly post op, UTI, worsened dementia and hyponatremia. Avoid Benzos and monitor     Leucocytosis -resolved. Secondary to incarcerated hernia and UTI.    Alcoholism - on CIWA protocol  -Avoid ativan. No signs of withdrawal    Severe PCM - advanced diet to soft . added supplements   Acute functional quadriplegia Seen by PT/OT. Needs SNF upon d/c   DVT prophylaxis  SCD's  Code Status: DNR Family Communication: No family at bedside  Disposition Plan: Remains inpatient until Na improved   HPI/Subjective: Seen and examined . Appears confused and sleepy. Received ativan early in the morning.   Objective: Filed Vitals:   09/14/14 1327  BP: 137/94  Pulse: 78  Temp: 97.4 F (36.3 C)  Resp: 20    Intake/Output Summary (Last 24 hours) at 09/14/14 1537 Last data filed at 09/14/14 1508  Gross per 24 hour  Intake   1020 ml  Output   1200 ml  Net   -180 ml   Filed Weights   09/12/14 1100 09/13/14 0414 09/14/14 0502  Weight: 53.5 kg (117 lb 15.1 oz) 47.6 kg (104 lb 15 oz) 49.1 kg (108 lb 3.9 oz)    Exam:   General:  Elderly male in NAD, confused  HEENT: moist  mucosa  Chest: clear b/l, no added sounds  CVS: NS1&S2, no murmurs  Abd: soft, ND, NT, surgical site clean  Ext: warm, no edema  CNS: AAOX0  Data Reviewed: Basic Metabolic Panel:  Recent Labs Lab 09/11/14 0525 09/12/14 0347 09/13/14 0410 09/14/14 0433 09/14/14 1151  NA 125* 122* 119* 119* 117*  K 3.4* 3.8 3.9 4.2 4.0  CL 89* 84* 85* 82* 83*  CO2 21 20 20 21 21   GLUCOSE 120* 82 105* 94 128*  BUN 3* 4* 7 6 5*  CREATININE 0.51 0.47* 0.54 0.54 0.51  CALCIUM 8.8 9.0 8.0* 8.5 8.6   Liver Function Tests:  Recent Labs Lab 09/08/14 2021 09/11/14 0525  AST 31 27  ALT 13 11  ALKPHOS 63 55  BILITOT 0.7 0.5  PROT 7.5 6.5  ALBUMIN 4.0 3.3*   No results for input(s): LIPASE, AMYLASE in the last 168 hours.  Recent Labs Lab 09/09/14 0007  AMMONIA 27   CBC:  Recent Labs Lab 09/08/14 2021 09/09/14 0340 09/10/14 1000 09/11/14 0525 09/12/14 0347 09/13/14 0410  WBC 19.8* 13.6* 11.6* 12.1* 11.3* 9.1  NEUTROABS 15.2* 10.4*  --   --   --   --   HGB 13.9 12.6* 11.9* 13.5 13.1 13.1  HCT 38.6* 35.1* 33.4* 37.2* 36.9* 36.5*  MCV 84.8 85.0 85.6 85.3 84.2 83.7  PLT 363 355 317 396 431* 416*   Cardiac Enzymes:  Recent Labs Lab 09/08/14 2021  TROPONINI <0.30   BNP (last 3 results) No results for input(s): PROBNP in the last 8760 hours. CBG:  Recent Labs Lab 09/13/14 2020 09/13/14 2344 09/14/14 0509 09/14/14 0740 09/14/14 1209  GLUCAP 103* 112* 92 106* 125*    Recent Results (from the past 240 hour(s))  MRSA PCR Screening     Status: None   Collection Time: 09/09/14  3:08 AM  Result Value Ref Range Status   MRSA by PCR NEGATIVE NEGATIVE Final    Comment:        The GeneXpert MRSA Assay (FDA approved for NASAL specimens only), is one component of a comprehensive MRSA colonization surveillance program. It is not intended to diagnose MRSA infection nor to guide or monitor treatment for MRSA infections.   Urine culture     Status: None   Collection  Time: 09/11/14  9:04 AM  Result Value Ref Range Status   Specimen Description URINE, RANDOM  Final   Special Requests NONE  Final   Culture  Setup Time   Final    09/11/2014 11:48 [facility]formed at [facility]    Colony Count NO GROWTH Performed at [facility]   Final   Culture NO GROWTH Performed at [facility]   Final   Report Status 09/12/2014 FINAL  Final  Culture, Urine     Status: None   Collection Time: 09/12/14 10:46 AM  Result Value Ref Range Status   Specimen Description URINE, CATHETERIZED  Final   Special Requests NONE  Final   Culture  Setup Time   Final    09/12/2014 19:00 Performed at [facility]    Colony Count NO GROWTH Performed at [facility]   Final   Culture NO GROWTH Performed at [facility]   Final   Report Status 09/13/2014 FINAL  Final     Studies: No results found.  Scheduled Meds: . antiseptic oral rinse  7 mL Mouth Rinse q12n4p  . cefTRIAXone (ROCEPHIN)  IV  1 g Intravenous Q24H  . chlorhexidine  15 mL Mouth Rinse BID  . feeding supplement (ENSURE COMPLETE)  237 mL Oral BID BM  . folic acid  1 mg Oral Daily  . hydrALAZINE  10 mg Oral 3 times per day  . multivitamin with minerals  1 tablet Oral Daily  . nicotine  21 mg Transdermal Daily  . sodium chloride  3 mL Intravenous Q12H  . thiamine  100 mg Oral Daily   Or  . thiamine  100 mg Intravenous Daily   Continuous Infusions:     Time spent: 25 MINUTES    Emaya Preston  [facility] Pager [phone]. If 7PM-7AM, please contact night-coverage at [email or web address], password [email or web address] 09/14/2014, 3:37 PM  LOS: 6 days

## 2014-09-14 NOTE — Progress Notes (Signed)
Sodium remains low at 119 - same value as yesterday

## 2014-09-14 NOTE — Progress Notes (Signed)
NUTRITION FOLLOW UP  Intervention:   -Ensure Complete TID modified to Ensure Pudding TID, each supplement provides 170 kcal and 4 grams of protein -Modified diet to include 800 ml free water fluid restriction -RD to continue to monitor  Nutrition Dx:   Inadequate oral intake related to inability to eat as evidenced by NPO status; ongoing now r/t to encephalopathy as evidenced by PO intake < 75%  Goal:   Pt to meet >/= 90% of their estimated nutrition needs; not met  Monitor:   Total protein/energy intake, labs, weights, GI profile  Assessment:   11/19: -Pt confused, no family in room to provide further food/nutrition hx -Pt s/p hernia repair. Is currently NPO pending swallowing ability -Has hx of ETOH abuse, and pt acknowledges weight loss and poor appetite pta. Could not provide further details -Has had hypoglycemic episode on 11/19 d/t NPO status, NP order D5. CBGs now WNL   11/23: -Diet advanced to Clear liquid on 11/20 -Diet advanced to soft diet on 11/22 -Pt PO intake 40-50% of meals. RN noted pt continues with confusion and restless -Ensure Complete was ordered; however was modified to Ensure pudding TID d/t for worsening hyponatremia and need for 800 ml fluid restriction -Faint BS noted, no BMs.   Height: Ht Readings from Last 1 Encounters:  09/09/14 $RemoveB'5\' 6"'uimiAcEO$  (1.676 m)    Weight Status:   Wt Readings from Last 1 Encounters:  09/14/14 108 lb 3.9 oz (49.1 kg)    Re-estimated needs:  Kcal: 1600-1800 Protein: 75-85 gram Fluid: >/= 1600 ml daily  Skin: surgical incision on abd  Diet Order: DIET SOFT   Intake/Output Summary (Last 24 hours) at 09/14/14 1808 Last data filed at 09/14/14 1508  Gross per 24 hour  Intake   1020 ml  Output   1200 ml  Net   -180 ml    Last BM: PTA   Labs:   Recent Labs Lab 09/13/14 0410 09/14/14 0433 09/14/14 1151  NA 119* 119* 117*  K 3.9 4.2 4.0  CL 85* 82* 83*  CO2 $Re'20 21 21  'YAB$ BUN 7 6 5*  CREATININE 0.54 0.54 0.51   CALCIUM 8.0* 8.5 8.6  GLUCOSE 105* 94 128*    CBG (last 3)   Recent Labs  09/14/14 0740 09/14/14 1209 09/14/14 1805  GLUCAP 106* 125* 102*    Scheduled Meds: . antiseptic oral rinse  7 mL Mouth Rinse q12n4p  . cefTRIAXone (ROCEPHIN)  IV  1 g Intravenous Q24H  . chlorhexidine  15 mL Mouth Rinse BID  . feeding supplement (ENSURE)  1 Container Oral TID BM  . folic acid  1 mg Oral Daily  . hydrALAZINE  10 mg Oral 3 times per day  . multivitamin with minerals  1 tablet Oral Daily  . nicotine  21 mg Transdermal Daily  . sodium chloride  3 mL Intravenous Q12H  . thiamine  100 mg Oral Daily   Or  . thiamine  100 mg Intravenous Daily    Continuous Infusions:    Atlee Abide MS RD LDN Clinical Dietitian [phone]

## 2014-09-14 NOTE — Progress Notes (Signed)
CRITICAL VALUE ALERT  Critical value received:  Serum Osmalality   Date of notification:  09/14/14  Time of notification:  1700  Critical value read back:Yes.    Nurse who received alert:  Mignon PineJenny Burns, RN   MD notified (1st page):  Dunghel   Time of first page:  1700  MD notified (2nd page):  Time of second page:  Responding MD:  Dunghel   Time MD responded: 1715

## 2014-09-14 NOTE — Plan of Care (Signed)
Problem: Phase I Progression Outcomes Goal: Voiding-avoid urinary catheter unless indicated Outcome: Completed/Met Date Met:  09/14/14

## 2014-09-14 NOTE — Progress Notes (Signed)
CRITICAL VALUE ALERT  Critical value received:  NA+ 116  Date of notification:  09/14/14  Time of notification:  2206  Critical value read back:Yes.    Nurse who received alert:  Murrell ReddenErika Daziyah Cogan, RN  MD notified (1st page):  Merdis DelayK. Schorr, NP  Time of first page:  2207  MD notified (2nd page):  Time of second page:  Responding MD:  Merdis DelayK Schorr, NP  Time MD responded:2300

## 2014-09-15 ENCOUNTER — Other Ambulatory Visit (HOSPITAL_COMMUNITY): Payer: Medicare Other

## 2014-09-15 ENCOUNTER — Encounter (HOSPITAL_COMMUNITY): Payer: Self-pay | Admitting: Radiology

## 2014-09-15 ENCOUNTER — Inpatient Hospital Stay (HOSPITAL_COMMUNITY): Payer: Medicare Other

## 2014-09-15 DIAGNOSIS — S06350D Traumatic hemorrhage of left cerebrum without loss of consciousness, subsequent encounter: Secondary | ICD-10-CM

## 2014-09-15 DIAGNOSIS — S065X9A Traumatic subdural hemorrhage with loss of consciousness of unspecified duration, initial encounter: Secondary | ICD-10-CM | POA: Diagnosis not present

## 2014-09-15 DIAGNOSIS — S065XAA Traumatic subdural hemorrhage with loss of consciousness status unknown, initial encounter: Secondary | ICD-10-CM | POA: Diagnosis not present

## 2014-09-15 DIAGNOSIS — I619 Nontraumatic intracerebral hemorrhage, unspecified: Secondary | ICD-10-CM

## 2014-09-15 DIAGNOSIS — I6201 Nontraumatic acute subdural hemorrhage: Secondary | ICD-10-CM

## 2014-09-15 DIAGNOSIS — S06340A Traumatic hemorrhage of right cerebrum without loss of consciousness, initial encounter: Secondary | ICD-10-CM

## 2014-09-15 LAB — PROTIME-INR
INR: 1.09 (ref 0.00–1.49)
Prothrombin Time: 14.2 seconds (ref 11.6–15.2)

## 2014-09-15 LAB — BASIC METABOLIC PANEL
Anion gap: 15 (ref 5–15)
Anion gap: 15 (ref 5–15)
Anion gap: 15 (ref 5–15)
BUN: 7 mg/dL (ref 6–23)
BUN: 8 mg/dL (ref 6–23)
BUN: 8 mg/dL (ref 6–23)
CHLORIDE: 82 meq/L — AB (ref 96–112)
CO2: 20 meq/L (ref 19–32)
CO2: 20 meq/L (ref 19–32)
CO2: 19 meq/L (ref 19–32)
Calcium: 8.8 mg/dL (ref 8.4–10.5)
Calcium: 8.9 mg/dL (ref 8.4–10.5)
Calcium: 9.1 mg/dL (ref 8.4–10.5)
Chloride: 83 mEq/L — ABNORMAL LOW (ref 96–112)
Chloride: 80 mEq/L — ABNORMAL LOW (ref 96–112)
Creatinine, Ser: 0.51 mg/dL (ref 0.50–1.35)
Creatinine, Ser: 0.56 mg/dL (ref 0.50–1.35)
Creatinine, Ser: 0.57 mg/dL (ref 0.50–1.35)
GFR calc Af Amer: >90 mL/min (ref >90)
GFR calc Af Amer: >90 mL/min (ref >90)
GFR calc Af Amer: >90 mL/min (ref >90)
GFR calc non Af Amer: >90 mL/min (ref >90)
GFR calc non Af Amer: >90 mL/min (ref >90)
GFR calc non Af Amer: >90 mL/min (ref >90)
GLUCOSE: 104 mg/dL — AB (ref 70–99)
GLUCOSE: 115 mg/dL — AB (ref 70–99)
GLUCOSE: 100 mg/dL — AB (ref 70–99)
POTASSIUM: 4.3 meq/L (ref 3.7–5.3)
POTASSIUM: 4.6 meq/L (ref 3.7–5.3)
Potassium: 4 mEq/L (ref 3.7–5.3)
Sodium: 118 mEq/L — CL (ref 137–147)
Sodium: 115 mEq/L — CL (ref 137–147)
Sodium: 116 mEq/L — CL (ref 137–147)

## 2014-09-15 LAB — GLUCOSE, CAPILLARY
GLUCOSE-CAPILLARY: 100 mg/dL — AB (ref 70–99)
Glucose-Capillary: 116 mg/dL — ABNORMAL HIGH (ref 70–99)
Glucose-Capillary: 114 mg/dL — ABNORMAL HIGH (ref 70–99)
Glucose-Capillary: 106 mg/dL — ABNORMAL HIGH (ref 70–99)
Glucose-Capillary: 108 mg/dL — ABNORMAL HIGH (ref 70–99)

## 2014-09-15 LAB — APTT: aPTT: 30 seconds (ref 24–37)

## 2014-09-15 MED ORDER — SODIUM CHLORIDE 1 G PO TABS
1.0000 g | ORAL_TABLET | Freq: Two times a day (BID) | ORAL | Status: DC
  Administered 2014-09-16: 1 g via ORAL
  Filled 2014-09-15 (×6): qty 1

## 2014-09-15 MED ORDER — POTASSIUM CHLORIDE IN NACL 40-0.9 MEQ/L-% IV SOLN
INTRAVENOUS | Status: DC
  Administered 2014-09-15 – 2014-09-18 (×5): 100 mL/h via INTRAVENOUS
  Filled 2014-09-15 (×12): qty 1000

## 2014-09-15 MED ORDER — SODIUM CHLORIDE 0.9 % IV SOLN: INTRAVENOUS | Status: DC

## 2014-09-15 NOTE — Progress Notes (Signed)
CRITICAL VALUE ALERT  Critical value received:  NA 118  Date of notification:  09/15/14  Time of notification:  0520  Critical value read back:Yes.    Nurse who received alert:  Murrell ReddenErika Mailee Klaas, RN  MD notified (1st page):  Merdis DelayK. Schorr, NP  Time of first page:  0530  MD notified (2nd page):  Time of second page:  Responding MD:  Merdis DelayK. Schorr, NP   Time MD responded:  0530

## 2014-09-15 NOTE — Consult Note (Signed)
Reason for Consult:ICH Referring Physician: Dhungel  CC: Altered mental status  HPI: Mark [name] is an 63 y.o. male with a history of alcoholism and dementia who presented on 11/18 with frequent falls and was found to have an incarcerated hernia s/p surgical repair on 11/18.  Head CT at that time showed no acute changes.  Hospitalization was complicated by UTI and hyponatremia.  Had a fall from his bed on 11/23.  Head CT after fall shows a right temporal intracerebral hemorrhage.  SDH was noted on the right as well.  Surgery did not feel that the patient was a good surgical candidate.  Although patient was able to eat breakfast this morning, he had a decrease in his mental status.  Repeat head CT shows increased hemorrhage density suggestive of re-bleeding and some occipital horn extension.    Past Medical History  Diagnosis Date  . Hypertension   . Stroke   . Dementia     Past Surgical History  Procedure Laterality Date  . Inguinal hernia repair Right 09/09/2014    Procedure: HERNIA REPAIR INGUINAL INCARCERATED with mesh;  Surgeon: Glenna FellowsBenjamin Hoxworth, MD;  Location: [facility];  Service: General;  Laterality: Right;    Family History  Problem Relation Age of Onset  . Dementia Mother     Social History:  reports that he has been smoking.  He does not have any smokeless tobacco history on file. He reports that he drinks alcohol. He reports that he does not use illicit drugs.  No Known Allergies  Medications:  Prior to Admission:  Prescriptions prior to admission  Medication Sig Dispense Refill Last Dose  . lisinopril (PRINIVIL,ZESTRIL) 20 MG tablet Take 20 mg by mouth daily.   09/08/2014 at Unknown time  . lisinopril (PRINIVIL,ZESTRIL) 10 MG tablet Take 10 mg by mouth daily.      Scheduled: . antiseptic oral rinse  7 mL Mouth Rinse q12n4p  . cefTRIAXone (ROCEPHIN)  IV  1 g Intravenous Q24H  . chlorhexidine  15 mL Mouth Rinse BID  . feeding supplement (ENSURE)  1 Container Oral  TID BM  . folic acid  1 mg Oral Daily  . hydrALAZINE  10 mg Oral 3 times per day  . multivitamin with minerals  1 tablet Oral Daily  . nicotine  21 mg Transdermal Daily  . sodium chloride  3 mL Intravenous Q12H  . sodium chloride  1 g Oral BID WC  . thiamine  100 mg Oral Daily   Or  . thiamine  100 mg Intravenous Daily    ROS: Unable to be obtained secondary to mental status  Physical Examination: Blood pressure 141/67, pulse 92, temperature 97.3 F (36.3 C), temperature source Axillary, resp. rate 18, height 5\' 6"  (1.676 m), weight 49.4 kg (108 lb 14.5 oz), SpO2 99 %.   Gen: Obtunded.  No acute distress HEENT-  Normocephalic, no lesions, without obvious abnormality.  Normal external eye and conjunctiva.  Normal TM's bilaterally.  Normal auditory canals and external ears. Normal external nose, mucus membranes and septum.  Normal pharynx. Neck supple with no masses, nodes, nodules or enlargement. Cardiovascular- S1, S2 normal, pulses palpable throughout Lungs- chest clear, no wheezing, rales, normal symmetric air entry Abdomen- soft, non-tender; bowel sounds normal; no masses,  no organomegaly Extremities- no edema.  Scrapes noted on left knee Musculature: No atrophy or fasciculations  Neurologic Examination Mental Status: Obtunded.  Unable to answer questions related to orientation.  Does not follow commands.  Speech dysarthric but does  say things like "I cant find your finger" and "hey".  Cranial Nerves: II: Discs flat bilaterally; Does not blink to confrontation from the left.  Appears to blink from the right, pupils equal, round.  Right briskly reactive.  Left sluggish.   III,IV, VI: ptosis not present, with oculocephalic maneuver patient does not cross midline to the left V,VII: left facial droop VIII: hearing normal bilaterally IX,X: gag reflex reduced XI: shoulder shrug decreased on the left XII: Unable to perform Motor: Right : Upper extremity   5/5    Left:     Upper  extremity   0/5  Lower extremity   5/5     Lower extremity   0/5 Tone and bulk:normal tone throughout; no atrophy noted Sensory: Does not respond to noxious stimuli throughout Deep Tendon Reflexes: 2+ and symmetric with absent AJ's bilaterally Plantars: Right: upgoing   Left: upgoing Cerebellar: No dysmetria noted but unable to see my finger per his report for finger to nose testing.   Gait: Unable to test secondary to mental status   Laboratory Studies:   Basic Metabolic Panel:  Recent Labs Lab 09/14/14 0433 09/14/14 1151 09/14/14 2057 09/15/14 0400 09/15/14 1220  NA 119* 117* 116* 118* 115*  K 4.2 4.0 4.7 4.0 4.3  CL 82* 83* 82* 83* 80*  CO2 21 21 21 20 20   GLUCOSE 94 128* 104* 104* 115*  BUN 6 5* 7 7 8   CREATININE 0.54 0.51 0.55 0.51 0.56  CALCIUM 8.5 8.6 8.9 8.8 8.9    Liver Function Tests:  Recent Labs Lab 09/08/14 2021 09/11/14 0525  AST 31 27  ALT 13 11  ALKPHOS 63 55  BILITOT 0.7 0.5  PROT 7.5 6.5  ALBUMIN 4.0 3.3*   No results for input(s): LIPASE, AMYLASE in the last 168 hours.  Recent Labs Lab 09/09/14 0007  AMMONIA 27    CBC:  Recent Labs Lab 09/08/14 2021 09/09/14 0340 09/10/14 1000 09/11/14 0525 09/12/14 0347 09/13/14 0410  WBC 19.8* 13.6* 11.6* 12.1* 11.3* 9.1  NEUTROABS 15.2* 10.4*  --   --   --   --   HGB 13.9 12.6* 11.9* 13.5 13.1 13.1  HCT 38.6* 35.1* 33.4* 37.2* 36.9* 36.5*  MCV 84.8 85.0 85.6 85.3 84.2 83.7  PLT 363 355 317 396 431* 416*    Cardiac Enzymes:  Recent Labs Lab 09/08/14 2021  TROPONINI <0.30    BNP: Invalid input(s): POCBNP  CBG:  Recent Labs Lab 09/15/14 0015 09/15/14 0405 09/15/14 0720 09/15/14 1201 09/15/14 1659  GLUCAP 116* 100* 114* 106* 108*    Microbiology: Results for orders placed or performed during the hospital encounter of 09/08/14  MRSA PCR Screening     Status: None   Collection Time: 09/09/14  3:08 AM  Result Value Ref Range Status   MRSA by PCR NEGATIVE NEGATIVE Final     Comment:        The GeneXpert MRSA Assay (FDA approved for NASAL specimens only), is one component of a comprehensive MRSA colonization surveillance program. It is not intended to diagnose MRSA infection nor to guide or monitor treatment for MRSA infections.   Urine culture     Status: None   Collection Time: 09/11/14  9:04 AM  Result Value Ref Range Status   Specimen Description URINE, RANDOM  Final   Special Requests NONE  Final   Culture  Setup Time   Final    09/11/2014 11:48 Performed at [facility]    Colony Count  NO GROWTH Performed at [facility]   Final   Culture NO GROWTH Performed at [facility]   Final   Report Status 09/12/2014 FINAL  Final  Culture, Urine     Status: None   Collection Time: 09/12/14 10:46 AM  Result Value Ref Range Status   Specimen Description URINE, CATHETERIZED  Final   Special Requests NONE  Final   Culture  Setup Time   Final    09/12/2014 19:00 Performed at [facility]    Colony Count NO GROWTH Performed at [facility]   Final   Culture NO GROWTH Performed at [facility]   Final   Report Status 09/13/2014 FINAL  Final    Coagulation Studies: No results for input(s): LABPROT, INR in the last 72 hours.  Urinalysis:  Recent Labs Lab 09/08/14 2332 09/11/14 0904  COLORURINE YELLOW AMBER*  LABSPEC 1.025 1.018  PHURINE 7.0 7.0  GLUCOSEU NEGATIVE NEGATIVE  HGBUR SMALL* TRACE*  BILIRUBINUR NEGATIVE SMALL*  KETONESUR NEGATIVE >80*  PROTEINUR NEGATIVE NEGATIVE  UROBILINOGEN 1.0 2.0*  NITRITE NEGATIVE NEGATIVE  LEUKOCYTESUR NEGATIVE SMALL*    Lipid Panel:     Component Value Date/Time   CHOL  04/09/2008 0555    171        ATP III CLASSIFICATION:  <200     mg/dL   Desirable  409-811  mg/dL   Borderline High  >=914    mg/dL   High   TRIG 93 78/29/5621 0555   HDL 48 04/09/2008 0555   CHOLHDL 3.6 04/09/2008 0555   VLDL 19 04/09/2008 0555   LDLCALC * 04/09/2008  0555    104        Total Cholesterol/HDL:CHD Risk Coronary Heart Disease Risk Table                     Men   Women  1/2 Average Risk   3.4   3.3    HgbA1C: No results found for: HGBA1C  Urine Drug Screen:     Component Value Date/Time   LABOPIA NONE DETECTED 09/08/2014 2332   COCAINSCRNUR NONE DETECTED 09/08/2014 2332   LABBENZ NONE DETECTED 09/08/2014 2332   AMPHETMU NONE DETECTED 09/08/2014 2332   THCU POSITIVE* 09/08/2014 2332   LABBARB NONE DETECTED 09/08/2014 2332    Alcohol Level:  Recent Labs Lab 09/08/14 2021  ETH <11    Imaging: Ct Head Wo Contrast  09/15/2014   CLINICAL DATA:  Followup subdural hematoma.  EXAM: CT HEAD WITHOUT CONTRAST  TECHNIQUE: Contiguous axial images were obtained from the base of the skull through the vertex without intravenous contrast.  COMPARISON:  CT head 09/14/2014  FINDINGS: Image quality degraded by significant motion. The entire study was repeated due to motion.  Large high-density acute hematoma in the right temporoparietal lobe measures approximately 6 x 3.3 cm. This is similar in size however shows increased density compared with the prior study suggesting interval rebleeding. In addition there is now blood layering in the occipital horns which was not present previously.  Thin high density subdural hematoma over the right temporal parietal lobe slightly smaller compared with the prior study.  Diffuse atrophy. Ventricular enlargement is stable and consistent with atrophy.  Advanced chronic microvascular ischemic change. Chronic infarct left thalamus and right cerebellum. Chronic occipital pole infarcts bilaterally. No acute ischemic infarct. Negative for mass lesion.  IMPRESSION: Large hematoma right temporoparietal lobe shows increased density compared to the prior study but is similar in  size. This may indicate rebleeding in the interval. There is now blood in the right occipital horn which was not present previously.  Right hemispheric  subdural hematoma slightly smaller now measuring approximately 2 mm in thickness. No shift of the midline structures.  These results will be called to the ordering clinician or representative by the Radiologist Assistant, and communication documented in the PACS or zVision Dashboard.   Electronically Signed   By: Marlan Palau M.D.   On: 09/15/2014 14:36   Ct Head Wo Contrast  09/14/2014   ADDENDUM REPORT: 09/14/2014 20:10  ADDENDUM: Critical Value/emergent results were called by telephone at the time of interpretation on 09/14/2014 at 8:10 pm to Fabian November, NP, who verbally acknowledged these results.   Electronically Signed   By: Signa Kell M.D.   On: 09/14/2014 20:10   09/14/2014   CLINICAL DATA:  Larey Seat from bed.  Hit head.  EXAM: CT HEAD WITHOUT CONTRAST  TECHNIQUE: Contiguous axial images were obtained from the base of the skull through the vertex without intravenous contrast.  COMPARISON:  09/08/2014.  FINDINGS: There is a large acute parenchymal hematoma within the right temporal lobe measuring 4.2 x 5.7 cm, image 19/series 2. There is a subdural hematoma overlying the right cerebral hemisphere which measures 5 mm, image 21/series 2. Prominence of the sulci and ventricles compatible with brain atrophy. There is diffuse low attenuation throughout the subcortical and periventricular white matter compatible with chronic microvascular disease. Multiple, old bilateral basal ganglia and thalamic infarcts are identified. Calcified atherosclerotic plaque is identified within the right middle cerebral artery. The paranasal sinuses are clear. The mastoid air cells are clear. The calvarium appears intact.  IMPRESSION: 1. Examination is positive for acute right subdural hematoma and large right temporal lobe parenchymal hematoma.  Electronically Signed: By: Signa Kell M.D. On: 09/14/2014 20:03     Assessment/Plan: 63 year old male with a history of alcoholism and frequent falls s/p repair of an  incarcerated hernia.  Had fall on yesterday.  All head imaging personally reviewed including Head CT from 11/17, 23 and 24.  Most recent head CT suggests re-bleeding.  Mental status has declined as well.  There has been no clinical evidence of seizure.  Patient is a DNR and to date family does not wish for aggressive intervention.  Therefore current level of care is appropriate.   Platelet count normal.  Would recheck clotting factors.    Recommendations: 1.  Continued correction of hyponatremia.  With ICH this does place the patient at a higher risk for the development of seizures.  Seizure precautions recommended but no prophylaxis at this time. 2.  EEG 3.  Repeat head CT in AM 4.  Neuro checks 5.  PT/PTT, INR  Case discussed with Dr. Edwena Blow, MD [facility] [phone] 09/15/2014, 5:41 PM

## 2014-09-15 NOTE — Consult Note (Signed)
Requesting Physician:  Dr. Gonzella Lexhungel Reason for Consult:  Hyponatremia - ? chronic and subacute HPI: The patient is a 63 y.o. year-old man with a prior history of HTN, alcoholism, dementia (according to notes - at baseline is largely dependent on others fir his daily living activities) who was admitted to the hospital 09/08/14 because of frequent falls. CT of the brain at that time showed generalized atrophy, chronic dilatation of the right lateral ventricle likely ex vacuo from an old infarct, old bilateral occipital infarcts.   He was found to have a large unreducible right inguinal hernia which was repaired by Dr. [name] on 09/09/14.   Serum sodium on admission was 117. As best I can tell, his sodium was improving from 117 to as high as 127 with isotonic sodium chloride, but he was then changed to hypotonic fluids (1/2 normal then plain D5) and sodium dropped to 119.  At that point 800 cc water restriction was intitiated.  Pt then had a fall, with resultant SDH and parenchymal hemorrhage. Felt not to be a surgical candidate per notes. Pt has deteriorated mentally, with weakness, unintelligible speech (was reportedly rather confused at baseline)  Now unable to take anything PO.      SODIUM  Date/Time Value Ref Range Status  09/15/2014 12:20 PM 115* 137 - 147 mEq/L Final  09/15/2014 04:00 AM 118* 137 - 147 mEq/L Final  09/14/2014 08:57 PM 116* 137 - 147 mEq/L Final  09/14/2014 11:51 AM 117* 137 - 147 mEq/L Final  09/14/2014 04:33 AM 119*  Water restriction initiated; pt then had fall with resultant acute right SDH and large right temporal parenchymal hematoma.  UOSM 479 137 - 147 mEq/L Final   09/13/2014 04:10 AM 119*  All IVF stopped 137 - 147 mEq/L Final  09/12/2014 03:47 AM 122*  IVF changed to D51/2 then plain D5 with potassium 137 - 147 mEq/L Final  09/11/2014 05:25 AM 125* 137 - 147 mEq/L Final  09/10/2014 10:00 AM 127*  IVF D5NS 137 - 147 mEq/L Final  09/09/2014 06:58 PM 125* 137 - 147  mEq/L Final  09/09/2014 03:15 PM 127* 137 - 147 mEq/L Final  09/09/2014 11:25 AM 122* 137 - 147 mEq/L Final  09/09/2014 07:33 AM 122* 137 - 147 mEq/L Final  09/09/2014 03:40 AM 124* 137 - 147 mEq/L Final  09/09/2014 12:07 AM 119* 137 - 147 mEq/L Final  09/08/2014 08:21 PM 117*   IVF Normal saline UOSM 256 137 - 147 mEq/L Final  04/10/2008 06:05 AM 137  Final  04/09/2008 05:55 AM 134*  Final  04/08/2008 03:03 PM 135  Final   Past Medical History  Diagnosis Date  . Hypertension   . Stroke   . Dementia    Past Surgical History  Procedure Laterality Date  . Inguinal hernia repair Right 09/09/2014    Procedure: HERNIA REPAIR INGUINAL INCARCERATED with mesh;  Surgeon: Glenna FellowsBenjamin Hoxworth, MD;  Location: [facility];  Service: General;  Laterality: Right;   Family History  Problem Relation Age of Onset  . Dementia Mother    Social History:  reports that he has been smoking.  He does not have any smokeless tobacco history on file. He reports that he drinks alcohol. He reports that he does not use illicit drugs.  Allergies: No Known Allergies  Home medications: Prior to Admission medications   Medication Sig Start Date End Date Taking? Authorizing Provider  lisinopril (PRINIVIL,ZESTRIL) 20 MG tablet Take 20 mg by mouth daily.   Yes Historical Provider,  MD  hydrALAZINE (APRESOLINE) 10 MG tablet Take 1 tablet (10 mg total) by mouth every 8 (eight) hours. 09/13/14   Dorothea OgleIskra M Myers, MD  lisinopril (PRINIVIL,ZESTRIL) 10 MG tablet Take 10 mg by mouth daily.    Historical Provider, MD  ondansetron (ZOFRAN) 4 MG tablet Take 1 tablet (4 mg total) by mouth every 6 (six) hours as needed for nausea. 09/13/14   Dorothea OgleIskra M Myers, MD    Inpatient medications: . antiseptic oral rinse  7 mL Mouth Rinse q12n4p  . cefTRIAXone (ROCEPHIN)  IV  1 g Intravenous Q24H  . chlorhexidine  15 mL Mouth Rinse BID  . feeding supplement (ENSURE)  1 Container Oral TID BM  . folic acid  1 mg Oral Daily  . hydrALAZINE  10 mg  Oral 3 times per day  . multivitamin with minerals  1 tablet Oral Daily  . nicotine  21 mg Transdermal Daily  . sodium chloride  3 mL Intravenous Q12H  . sodium chloride  1 g Oral BID WC  . thiamine  100 mg Oral Daily   Or  . thiamine  100 mg Intravenous Daily    Review of Systems Essentially unobtainable as pt can only tell me his name and his speech in unintelligible   Physical Exam:  Blood pressure 126/57, pulse 87, temperature 98.1 F (36.7 C), temperature source Axillary, resp. rate 18, height 5\' 6"  (1.676 m), weight 49.4 kg (108 lb 14.5 oz), SpO2 92 %.  Gen: Thin frail WM. Speech almost unintelligible. Can tell me his name only.  Eyes sunken back Stiff, cannot assess strength Skin: Dry, flaky Neck: no JVD (neck veins not seen in the supine position)  Chest: clear without crackles or wheezes Heart: Tachy 96  S1S2 No S3 regular, no rub Abdomen: soft, scaphoid.  Incision right lower abd from hernia repair clean and dry no drainage.  No abd bruits Ext: No edema. Bilateral increased tone LE's Neuro: Cannot assess degree of weakness, orientation, etc    Basic Metabolic Panel:  Recent Labs Lab 09/12/14 0347 09/13/14 0410 09/14/14 0433 09/14/14 1151 09/14/14 2057 09/15/14 0400 09/15/14 1220  NA 122* 119* 119* 117* 116* 118* 115*  K 3.8 3.9 4.2 4.0 4.7 4.0 4.3  CL 84* 85* 82* 83* 82* 83* 80*  CO2 20 20 21 21 21 20 20   GLUCOSE 82 105* 94 128* 104* 104* 115*  BUN 4* 7 6 5* 7 7 8   CREATININE 0.47* 0.54 0.54 0.51 0.55 0.51 0.56  CALCIUM 9.0 8.0* 8.5 8.6 8.9 8.8 8.9    Recent Labs Lab 09/08/14 2021 09/11/14 0525  AST 31 27  ALT 13 11  ALKPHOS 63 55  BILITOT 0.7 0.5  PROT 7.5 6.5  ALBUMIN 4.0 3.3*   No results for input(s): LIPASE, AMYLASE in the last 168 hours. Recent Labs Lab 09/09/14 0007  AMMONIA 27     Recent Labs Lab 09/08/14 2021 09/09/14 0340 09/10/14 1000 09/11/14 0525 09/12/14 0347 09/13/14 0410  WBC 19.8* 13.6* 11.6* 12.1* 11.3* 9.1   NEUTROABS 15.2* 10.4*  --   --   --   --   HGB 13.9 12.6* 11.9* 13.5 13.1 13.1  HCT 38.6* 35.1* 33.4* 37.2* 36.9* 36.5*  MCV 84.8 85.0 85.6 85.3 84.2 83.7  PLT 363 355 317 396 431* 416*     Recent Labs Lab 09/08/14 2021  TROPONINI <0.30     Recent Labs Lab 09/14/14 1805 09/15/14 0015 09/15/14 0405 09/15/14 0720 09/15/14 1201  GLUCAP 102*  116* 100* 114* 106*   Results for AAKASH, HOLLOMON (MRN [id number]) as of 09/15/2014 13:28  Ref. Range 09/08/2014 23:32 09/11/2014 09:04 09/14/2014 07:32  Osmolality, Ur Latest Range: [phone] mOsm/kg 256 (L)  479  Sodium, Ur No range found 48  89    Xrays/Other Studies: Ct Head Wo Contrast  09/14/2014   ADDENDUM REPORT: 09/14/2014 20:10  ADDENDUM: Critical Value/emergent results were called by telephone at the time of interpretation on 09/14/2014 at 8:10 pm to Fabian November, NP, who verbally acknowledged these results.   Electronically Signed   By: Signa Kell M.D.   On: 09/14/2014 20:10   09/14/2014   CLINICAL DATA:  Larey Seat from bed.  Hit head.  EXAM: CT HEAD WITHOUT CONTRAST  TECHNIQUE: Contiguous axial images were obtained from the base of the skull through the vertex without intravenous contrast.  COMPARISON:  09/08/2014.  FINDINGS: There is a large acute parenchymal hematoma within the right temporal lobe measuring 4.2 x 5.7 cm, image 19/series 2. There is a subdural hematoma overlying the right cerebral hemisphere which measures 5 mm, image 21/series 2. Prominence of the sulci and ventricles compatible with brain atrophy. There is diffuse low attenuation throughout the subcortical and periventricular white matter compatible with chronic microvascular disease. Multiple, old bilateral basal ganglia and thalamic infarcts are identified. Calcified atherosclerotic plaque is identified within the right middle cerebral artery. The paranasal sinuses are clear. The mastoid air cells are clear. The calvarium appears intact.  IMPRESSION: 1.  Examination is positive for acute right subdural hematoma and large right temporal lobe parenchymal hematoma.  Electronically Signed: By: Signa Kell M.D. On: 09/14/2014 20:03    Impression/Plan 1. Hyponatremia - Very difficult situation in this patient. There is probably some chronicity (sodium low 130's in 2009), improved some with NS administration earlier in the admission (see table in HPI) though not to normal.  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 In addition he looks volume depleted to me on exam.  UOSM of 479 can fit with either vol depletion or SIADH or both.  If this is pure SIADH his sodium will likely drop further if we give him isotonic saline, but volume depletion still needs to be corrected (and he did have a favorable response to isotonic saline earlier in the admission). Will start with NS at 100cc/hour with KCl and if over next 12 hours Na drops further, then hang 500 ml of 3%saline to run at 30 cc/hour and continue with serial sodium measurements.  2. Acute SDH and large parenchymal bleed due to mechanical fall - not felt surgical candidate - palliative approach may be best here... 3. 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 HTN - getting prn meds IV 5. Chronic alcoholism 6. Malnutrition 7. DNR status  Camille Bal, MD [facility] [phone] Pager 09/15/2014, 2:06 PM

## 2014-09-15 NOTE — Progress Notes (Signed)
CRITICAL VALUE ALERT  Critical value received: Na 115  Date of notification: 09/15/14  Time of notification:  1320  Critical value read back yes  Nurse who received alert:  S.Gagliano, RN  MD notified (1st page): Dhungel  Time of first page:  1324  MD notified (2nd page):  Time of second page:  Responding MD:  Dhungel  Time MD responded:  1328

## 2014-09-15 NOTE — Progress Notes (Addendum)
Repeat head CT reviewed. Increased density of the large right temporoparietal lobe hematoma which possibly indicates that the bleeding in the interval. Also shows blood in the right occipital horn which is new. Right hemispheric subdural hematoma slightly smaller in size compared to yesterday without any midline shift.  Discussed with neurology consult with seen the patient. Recommends to monitor clinically if no aggressive intervention planned.  Neurosurgery on call was consulted last night after initial CT scan and recommended patient was a poor candidate for surgery. I spoke again with patient's brother-in-law Iran PlanasBen Atkinson. Discussed patient's possible poor prognosis if mental status does not improve, has further intracerebral bleed or seizures. Patient is at high risk for developing seizures with his intracranial bleed and hyponatremia. -Patient will be transferred to stepdown unit for close monitoring and neuro checks for the next 24 hours. Will repeat a head CT in a.m. EEG ordered by neurology.. Discussed with brother-in-law that patient is a poor surgical candidate and he understands.  -Patient is a no CODE BLUE and brother-in-law does not want him to be on a ventilator if needed. (This is as per patient's wishes) -He is aware and in agreement that if patient does not improve clinically or shows rapid decline in symptoms comfort care would be the next option.

## 2014-09-15 NOTE — Progress Notes (Signed)
Follow-up:  Notified by Dr Bradly ChrisStroud in radiology that pt's head ct w/ positive findings. Imaging ordered by attending MD s/p un-witnessed fall in room just prior to shift change. Pt remains at his baseline of alert but confused w/ periods of restlessness/agitation and stable VS. Ct head w/o cm reveals acute (R) subdural hematoma (5mm) and a large 4.2 x 5.7 cm (R) temporal lobe parenchymal hematoma. Pt does have a recent h/o multiple falls. Discussed pt w/ Dr [name] w/ neurosurgery service who stated that surgical intervention would not be recommended as well as the fact that the pt is a poor surgical candidate. I spoke w/ pt's brother-in-law, Iran PlanasBen Atkinson regarding CT results and neurosurgeons recommendations. He ask that we be sure there is a Comptrollersitter at bedside. Spoke w/ RN who confirms that sitter is at bedside. RN ask to notify for any changes in mentation or significant changes in VS. Will continue to monitor closely.   Leanne ChangKatherine P. Ketrick Matney, NP-C [facility] Pager [phone]

## 2014-09-15 NOTE — Progress Notes (Signed)
[facility] PROGRESS NOTE  Mark BilberryMichael [name] RUE:[id number] DOB: August 31, 1951 DOA: 09/08/2014 PCP: [name],[name], MD   Brief narrative Brief narrative: 63 y.o. male history of hypertension and alcoholism, previous stroke was brought to the ER after patient was found to have frequent falls. Patient lives with his friend in an apartment and is frequently checked by his sister. Patient's sister stated that patient was recently diagnosed with dementia and is largely dependent on others for his daily living activities. Over the last 1 week patient's friend noted that patient has been having frequent falls but did not lose consciousness. So patient was brought to the ER. In the ER CT of the head did not show anything acute and on exam patient had large right inguinal hernia which was not reducible and CT abdomen and pelvis showed features concerning for obstruction, on-call surgeon was consulted. patient noted to have hyponatremia on presentation with Na of 117.  Pt underwent inguinal hernia repair by Dr. [name] 09/09/2014.  Hospital course prolonged due to slow recovery, persistent hyponatremia and fall with acute rt subdural and left large intraparenchymal bleed wit mental status changes.  Assessment/Plan: Principal Problem:  Incarcerated inguinal hernia - status post  repair with mesh, post op day #6 - was tolerating advanced diet, now with MS changes - continue to clean site with soap and water, no need for dressing, keep surgical area dry and clean   Active Problems:  Hyponatremia Seems to be chronic , Na was 117 on presentation. 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 D/w Dr Eliott Nineunham . 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 Cortisol and TSH normal.  - Recommended adding salt tablets. Pt unable to take meds this morning Restrict fluid to 800 cc/24 hrs and monitor Na closely. Monitor on  telemetry   Fall with rt subdural and left large parenchymal bleed on 11/23 Secondary to mechanical fall and hit his head on the right. Patient was alert and oriented post fall and reported losing his balance. This morning he was oriented and ate his breakfast. During the  day he has been sleepy and lethargic and not moving his left extremity and non verbal. Will repeat head CT. Covering NP during the night d/w neurosurgery on call Dr Rolanda JayNandkumar who suggested pt was not a good surgical candidate.   encephalopathy Multifactorial, post op, UTI and now with cerebral bleed. Repeat head CT   Hypokalemia - supplemented    Hypertension -stable. Prn hydralazine   Encephalopathy Possibly post op, UTI, worsened dementia and hyponatremia. Avoid Benzos and monitor     Leucocytosis -resolved. Secondary to incarcerated hernia and UTI.    hx of heavy Alcoholism - on CIWA protocol   No signs of withdrawal    Severe PCM - advanced diet to soft . added supplements   Acute functional quadriplegia Seen by PT/OT. Needs SNF upon d/c   DVT prophylaxis  SCD's  Code Status: DNR Family Communication: spoke with brother in law [name] Disposition Plan: Remains inpatient . Prognosis seems guarded. May need palliative care evaluation of condition detiriorates   HPI/Subjective: Seen and examined .pt sleepy and lethargic, non verbal. Not moving left extremity   Objective: Filed Vitals:   09/15/14 1107  BP: 126/57  Pulse: 87  Temp: 98.1 F (36.7 C)  Resp: 18    Intake/Output Summary (Last 24 hours) at 09/15/14 1229 Last data filed at 09/15/14 1040  Gross per 24 hour  Intake  543 ml  Output    275 ml  Net    268 ml   Filed Weights   09/13/14 0414 09/14/14 0502 09/15/14 0516  Weight: 47.6 kg (104 lb 15 oz) 49.1 kg (108 lb 3.9 oz) 49.4 kg (108 lb 14.5 oz)    Exam:   General:  Elderly male sleepy and non verbal  HEENT: moist mucosa, temporal wasting  Chest:  clear b/l, no added sounds  CVS: NS1&S2, no murmurs  Abd: soft, ND, NT, surgical site clean  Ext: warm, no edema  CNS: AAOX0, non verbal, not moving head to left side or moving left extremities  Data Reviewed: Basic Metabolic Panel:  Recent Labs Lab 09/13/14 0410 09/14/14 0433 09/14/14 1151 09/14/14 2057 09/15/14 0400  NA 119* 119* 117* 116* 118*  K 3.9 4.2 4.0 4.7 4.0  CL 85* 82* 83* 82* 83*  CO2 20 21 21 21 20   GLUCOSE 105* 94 128* 104* 104*  BUN 7 6 5* 7 7  CREATININE 0.54 0.54 0.51 0.55 0.51  CALCIUM 8.0* 8.5 8.6 8.9 8.8   Liver Function Tests:  Recent Labs Lab 09/08/14 2021 09/11/14 0525  AST 31 27  ALT 13 11  ALKPHOS 63 55  BILITOT 0.7 0.5  PROT 7.5 6.5  ALBUMIN 4.0 3.3*   No results for input(s): LIPASE, AMYLASE in the last 168 hours.  Recent Labs Lab 09/09/14 0007  AMMONIA 27   CBC:  Recent Labs Lab 09/08/14 2021 09/09/14 0340 09/10/14 1000 09/11/14 0525 09/12/14 0347 09/13/14 0410  WBC 19.8* 13.6* 11.6* 12.1* 11.3* 9.1  NEUTROABS 15.2* 10.4*  --   --   --   --   HGB 13.9 12.6* 11.9* 13.5 13.1 13.1  HCT 38.6* 35.1* 33.4* 37.2* 36.9* 36.5*  MCV 84.8 85.0 85.6 85.3 84.2 83.7  PLT 363 355 317 396 431* 416*   Cardiac Enzymes:  Recent Labs Lab 09/08/14 2021  TROPONINI <0.30   BNP (last 3 results) No results for input(s): PROBNP in the last 8760 hours. CBG:  Recent Labs Lab 09/14/14 1805 09/15/14 0015 09/15/14 0405 09/15/14 0720 09/15/14 1201  GLUCAP 102* 116* 100* 114* 106*    Recent Results (from the past 240 hour(s))  MRSA PCR Screening     Status: None   Collection Time: 09/09/14  3:08 AM  Result Value Ref Range Status   MRSA by PCR NEGATIVE NEGATIVE Final    Comment:        The GeneXpert MRSA Assay (FDA approved for NASAL specimens only), is one component of a comprehensive MRSA colonization surveillance program. It is not intended to diagnose MRSA infection nor to guide or monitor treatment for MRSA  infections.   Urine culture     Status: None   Collection Time: 09/11/14  9:04 AM  Result Value Ref Range Status   Specimen Description URINE, RANDOM  Final   Special Requests NONE  Final   Culture  Setup Time   Final    09/11/2014 11:48 Performed at [facility]    Colony Count NO GROWTH Performed at [facility]   Final   Culture NO GROWTH Performed at [facility]   Final   Report Status 09/12/2014 FINAL  Final  Culture, Urine     Status: None   Collection Time: 09/12/14 10:46 AM  Result Value Ref Range Status   Specimen Description URINE, CATHETERIZED  Final   Special Requests NONE  Final   Culture  Setup Time  Final    09/12/2014 19:00 Performed at [facility]    Colony Count NO GROWTH Performed at [facility]   Final   Culture NO GROWTH Performed at [facility]   Final   Report Status 09/13/2014 FINAL  Final     Studies: Ct Head Wo Contrast  09/14/2014   ADDENDUM REPORT: 09/14/2014 20:10  ADDENDUM: Critical Value/emergent results were called by telephone at the time of interpretation on 09/14/2014 at 8:10 pm to Fabian Novemberatherine Shorr, NP, who verbally acknowledged these results.   Electronically Signed   By: Signa Kellaylor  Stroud M.D.   On: 09/14/2014 20:10   09/14/2014   CLINICAL DATA:  Larey SeatFell from bed.  Hit head.  EXAM: CT HEAD WITHOUT CONTRAST  TECHNIQUE: Contiguous axial images were obtained from the base of the skull through the vertex without intravenous contrast.  COMPARISON:  09/08/2014.  FINDINGS: There is a large acute parenchymal hematoma within the right temporal lobe measuring 4.2 x 5.7 cm, image 19/series 2. There is a subdural hematoma overlying the right cerebral hemisphere which measures 5 mm, image 21/series 2. Prominence of the sulci and ventricles compatible with brain atrophy. There is diffuse low attenuation throughout the subcortical and periventricular white matter compatible with chronic microvascular disease.  Multiple, old bilateral basal ganglia and thalamic infarcts are identified. Calcified atherosclerotic plaque is identified within the right middle cerebral artery. The paranasal sinuses are clear. The mastoid air cells are clear. The calvarium appears intact.  IMPRESSION: 1. Examination is positive for acute right subdural hematoma and large right temporal lobe parenchymal hematoma.  Electronically Signed: By: Signa Kellaylor  Stroud M.D. On: 09/14/2014 20:03    Scheduled Meds: . antiseptic oral rinse  7 mL Mouth Rinse q12n4p  . cefTRIAXone (ROCEPHIN)  IV  1 g Intravenous Q24H  . chlorhexidine  15 mL Mouth Rinse BID  . feeding supplement (ENSURE)  1 Container Oral TID BM  . folic acid  1 mg Oral Daily  . hydrALAZINE  10 mg Oral 3 times per day  . multivitamin with minerals  1 tablet Oral Daily  . nicotine  21 mg Transdermal Daily  . sodium chloride  3 mL Intravenous Q12H  . sodium chloride  1 g Oral BID WC  . thiamine  100 mg Oral Daily   Or  . thiamine  100 mg Intravenous Daily   Continuous Infusions:     Time spent: 25 MINUTES    Oluwadamilola Rosamond  [facility] Pager [phone]. If 7PM-7AM, please contact night-coverage at [email or web address], password [email or web address] 09/15/2014, 12:29 PM  LOS: 7 days

## 2014-09-16 ENCOUNTER — Inpatient Hospital Stay (HOSPITAL_COMMUNITY)
Admission: EM | Admit: 2014-09-16 | Discharge: 2014-09-16 | Disposition: A | Discharge status: Home / Self Care | Payer: Medicare Other | Source: Home / Self Care | Attending: Internal Medicine | Admitting: Internal Medicine

## 2014-09-16 ENCOUNTER — Inpatient Hospital Stay (HOSPITAL_COMMUNITY): Payer: Medicare Other

## 2014-09-16 ENCOUNTER — Encounter (HOSPITAL_COMMUNITY): Payer: Self-pay | Admitting: Radiology

## 2014-09-16 DIAGNOSIS — R451 Restlessness and agitation: Secondary | ICD-10-CM | POA: Insufficient documentation

## 2014-09-16 DIAGNOSIS — S06340D Traumatic hemorrhage of right cerebrum without loss of consciousness, subsequent encounter: Secondary | ICD-10-CM

## 2014-09-16 DIAGNOSIS — S06360A Traumatic hemorrhage of cerebrum, unspecified, without loss of consciousness, initial encounter: Secondary | ICD-10-CM | POA: Insufficient documentation

## 2014-09-16 DIAGNOSIS — Z7189 Other specified counseling: Secondary | ICD-10-CM

## 2014-09-16 DIAGNOSIS — S0633AA Contusion and laceration of cerebrum, unspecified, with loss of consciousness status unknown, initial encounter: Secondary | ICD-10-CM | POA: Insufficient documentation

## 2014-09-16 DIAGNOSIS — Z515 Encounter for palliative care: Secondary | ICD-10-CM | POA: Insufficient documentation

## 2014-09-16 LAB — BASIC METABOLIC PANEL
Anion gap: 15 (ref 5–15)
BUN: 7 mg/dL (ref 6–23)
CALCIUM: 8.7 mg/dL (ref 8.4–10.5)
CO2: 18 mEq/L — ABNORMAL LOW (ref 19–32)
CREATININE: 0.53 mg/dL (ref 0.50–1.35)
Chloride: 84 mEq/L — ABNORMAL LOW (ref 96–112)
GFR calc Af Amer: >90 mL/min (ref >90)
GLUCOSE: 107 mg/dL — AB (ref 70–99)
Potassium: 4.5 mEq/L (ref 3.7–5.3)
Sodium: 117 mEq/L — CL (ref 137–147)

## 2014-09-16 LAB — RENAL FUNCTION PANEL
ALBUMIN: 3.2 g/dL — AB (ref 3.5–5.2)
Albumin: 3 g/dL — ABNORMAL LOW (ref 3.5–5.2)
Anion gap: 15 (ref 5–15)
Anion gap: 12 (ref 5–15)
BUN: 7 mg/dL (ref 6–23)
BUN: 7 mg/dL (ref 6–23)
CALCIUM: 8.7 mg/dL (ref 8.4–10.5)
CO2: 20 mEq/L (ref 19–32)
CO2: 19 meq/L (ref 19–32)
CREATININE: 0.56 mg/dL (ref 0.50–1.35)
CREATININE: 0.54 mg/dL (ref 0.50–1.35)
Calcium: 9 mg/dL (ref 8.4–10.5)
Chloride: 87 mEq/L — ABNORMAL LOW (ref 96–112)
Chloride: 86 mEq/L — ABNORMAL LOW (ref 96–112)
GFR calc Af Amer: >90 mL/min (ref >90)
GFR calc non Af Amer: >90 mL/min (ref >90)
GLUCOSE: 89 mg/dL (ref 70–99)
Glucose, Bld: 97 mg/dL (ref 70–99)
PHOSPHORUS: 2.9 mg/dL (ref 2.3–4.6)
Phosphorus: 2.7 mg/dL (ref 2.3–4.6)
Potassium: 4.6 mEq/L (ref 3.7–5.3)
Potassium: 4.6 mEq/L (ref 3.7–5.3)
Sodium: 122 mEq/L — ABNORMAL LOW (ref 137–147)
Sodium: 117 mEq/L — CL (ref 137–147)

## 2014-09-16 LAB — GLUCOSE, CAPILLARY
GLUCOSE-CAPILLARY: 111 mg/dL — AB (ref 70–99)
GLUCOSE-CAPILLARY: 112 mg/dL — AB (ref 70–99)
Glucose-Capillary: 106 mg/dL — ABNORMAL HIGH (ref 70–99)
Glucose-Capillary: 90 mg/dL (ref 70–99)
Glucose-Capillary: 99 mg/dL (ref 70–99)
Glucose-Capillary: 99 mg/dL (ref 70–99)

## 2014-09-16 MED ORDER — MORPHINE SULFATE (CONCENTRATE) 10 MG/0.5ML PO SOLN: 5.0000 mg | ORAL | Status: DC | PRN

## 2014-09-16 MED ORDER — LEVETIRACETAM IN NACL 1000 MG/100ML IV SOLN
1000.0000 mg | INTRAVENOUS | Status: AC
Start: 2014-09-16 — End: 2014-09-16
  Administered 2014-09-16: 1000 mg via INTRAVENOUS
  Filled 2014-09-16: qty 100

## 2014-09-16 MED ORDER — MORPHINE SULFATE 2 MG/ML IJ SOLN: 2.0000 mg | INTRAMUSCULAR | Status: DC | PRN

## 2014-09-16 MED ORDER — HALOPERIDOL LACTATE 5 MG/ML IJ SOLN: 1.0000 mg | INTRAMUSCULAR | Status: DC | PRN

## 2014-09-16 MED ORDER — LEVETIRACETAM IN NACL 500 MG/100ML IV SOLN
500.0000 mg | Freq: Two times a day (BID) | INTRAVENOUS | Status: DC
  Administered 2014-09-17 (×2): 500 mg via INTRAVENOUS
  Filled 2014-09-16 (×3): qty 100

## 2014-09-16 NOTE — Progress Notes (Addendum)
[facility] Rounding Note Subjective:  Looking around a little more than yesterday When asked if any pain answered "no" but wouldn't really follow any commands  It appears his serial bmet order expired - last labs from 0330 sodium had increased 115->116->117 but no lab since then  Getting isotonic sodium chloride with 40 KCL (initially recommended salt tablets but po intake not consistent d/t pt lethargy so has had only 1 dose since ordered - will discontinue) CT today showed no new hemorrhage  Objective Vital signs in last 24 hours: Filed Vitals:   09/16/14 0800 09/16/14 1000 09/16/14 1200 09/16/14 1248  BP: 158/75 156/85 147/99   Pulse: 108 101 91   Temp:    98.6 F (37 C)  TempSrc:    Oral  Resp: 17 19 16    Height:      Weight:      SpO2: 96% 95% 96%    Weight change: 0.3 kg (10.6 oz)  Intake/Output Summary (Last 24 hours) at 09/16/14 1323 Last data filed at 09/16/14 1200  Gross per 24 hour  Intake   2133 ml  Output    450 ml  Net   1683 ml   Physical Exam:  BP 147/99 mmHg  Pulse 91  Temp(Src) 98.6 F (37 C) (Oral)  Resp 16  Ht 5\' 6"  (1.676 m)  Wt 49.7 kg (109 lb 9.1 oz)  BMI 17.69 kg/m2  SpO2 96% Skin with reddened flaky patches Turns had and answers "no" when questioned about pain but remains in a contracted position and won't really follow commands for me No JVD Lungs clear Condom cath - clear urine  No edema of the LE's   Recent Labs Lab 09/14/14 0433 09/14/14 1151 09/14/14 2057 09/15/14 0400 09/15/14 1220 09/15/14 1952 09/16/14 0332  NA 119* 117* 116* 118* 115* 116* 117*  K 4.2 4.0 4.7 4.0 4.3 4.6 4.5  CL 82* 83* 82* 83* 80* 82* 84*  CO2 21 21 21 20 20 19  18*  GLUCOSE 94 128* 104* 104* 115* 100* 107*  BUN 6 5* 7 7 8 8 7   CREATININE 0.54 0.51 0.55 0.51 0.56 0.57 0.53  CALCIUM 8.5 8.6 8.9 8.8 8.9 9.1 8.7    Recent Labs Lab 09/11/14 0525  AST 27  ALT 11  ALKPHOS 55  BILITOT 0.5  PROT 6.5  ALBUMIN 3.3*   Recent  Labs Lab 09/10/14 1000 09/11/14 0525 09/12/14 0347 09/13/14 0410  WBC 11.6* 12.1* 11.3* 9.1  HGB 11.9* 13.5 13.1 13.1  HCT 33.4* 37.2* 36.9* 36.5*  MCV 85.6 85.3 84.2 83.7  PLT 317 396 431* 416*   Recent Labs Lab 09/15/14 1659 09/15/14 2009 09/16/14 0013 09/16/14 0352 09/16/14 0807  GLUCAP 108* 99 106* 112* 99   Studies/Results:  Ct Head Wo Contrast  09/16/2014   CLINICAL DATA:  Intracranial hemorrhage.  EXAM: CT HEAD WITHOUT CONTRAST  TECHNIQUE: Contiguous axial images were obtained from the base of the skull through the vertex without intravenous contrast.  COMPARISON:  CT 09/15/2014  FINDINGS: Right temporal lobe hematoma appears unchanged and measures approximately 6.5 x 3.0 cm. Small right temporal and right parietal subdural hematoma unchanged. No new hemorrhage. No shift of the midline structures.  Mild intraventricular hemorrhage is unchanged in the occipital horn bilaterally.  Generalized atrophy of a moderate degree. Extensive chronic ischemic changes are stable.  IMPRESSION: No change from yesterday.  No new hemorrhage.   Electronically Signed   By: Marlan Palau M.D.   On: 09/16/2014  12:31   Ct Head Wo Contrast  09/15/2014   CLINICAL DATA:  Followup subdural hematoma.  EXAM: CT HEAD WITHOUT CONTRAST  TECHNIQUE: Contiguous axial images were obtained from the base of the skull through the vertex without intravenous contrast.  COMPARISON:  CT head 09/14/2014  FINDINGS: Image quality degraded by significant motion. The entire study was repeated due to motion.  Large high-density acute hematoma in the right temporoparietal lobe measures approximately 6 x 3.3 cm. This is similar in size however shows increased density compared with the prior study suggesting interval rebleeding. In addition there is now blood layering in the occipital horns which was not present previously.  Thin high density subdural hematoma over the right temporal parietal lobe slightly smaller compared with the  prior study.  Diffuse atrophy. Ventricular enlargement is stable and consistent with atrophy.  Advanced chronic microvascular ischemic change. Chronic infarct left thalamus and right cerebellum. Chronic occipital pole infarcts bilaterally. No acute ischemic infarct. Negative for mass lesion.  IMPRESSION: Large hematoma right temporoparietal lobe shows increased density compared to the prior study but is similar in size. This may indicate rebleeding in the interval. There is now blood in the right occipital horn which was not present previously.  Right hemispheric subdural hematoma slightly smaller now measuring approximately 2 mm in thickness. No shift of the midline structures.  These results will be called to the ordering clinician or representative by the Radiologist Assistant, and communication documented in the PACS or zVision Dashboard.   Electronically Signed   By: Marlan Palauharles  [name] M.D.   On: 09/15/2014 14:36   Ct Head Wo Contrast  09/14/2014   ADDENDUM REPORT: 09/14/2014 20:10  ADDENDUM: Critical Value/emergent results were called by telephone at the time of interpretation on 09/14/2014 at 8:10 pm to Fabian Novemberatherine Shorr, NP, who verbally acknowledged these results.   Electronically Signed   By: Signa Kellaylor  Stroud M.D.   On: 09/14/2014 20:10   09/14/2014   CLINICAL DATA:  Larey SeatFell from bed.  Hit head.  EXAM: CT HEAD WITHOUT CONTRAST  TECHNIQUE: Contiguous axial images were obtained from the base of the skull through the vertex without intravenous contrast.  COMPARISON:  09/08/2014.  FINDINGS: There is a large acute parenchymal hematoma within the right temporal lobe measuring 4.2 x 5.7 cm, image 19/series 2. There is a subdural hematoma overlying the right cerebral hemisphere which measures 5 mm, image 21/series 2. Prominence of the sulci and ventricles compatible with brain atrophy. There is diffuse low attenuation throughout the subcortical and periventricular white matter compatible with chronic microvascular  disease. Multiple, old bilateral basal ganglia and thalamic infarcts are identified. Calcified atherosclerotic plaque is identified within the right middle cerebral artery. The paranasal sinuses are clear. The mastoid air cells are clear. The calvarium appears intact.  IMPRESSION: 1. Examination is positive for acute right subdural hematoma and large right temporal lobe parenchymal hematoma.  Electronically Signed: By: Signa Kellaylor  Stroud M.D. On: 09/14/2014 20:03   Medications: . 0.9 % NaCl with KCl 40 mEq / L 100 mL/hr (09/15/14 1512)   . antiseptic oral rinse  7 mL Mouth Rinse q12n4p  . cefTRIAXone (ROCEPHIN)  IV  1 g Intravenous Q24H  . chlorhexidine  15 mL Mouth Rinse BID  . feeding supplement (ENSURE)  1 Container Oral TID BM  . folic acid  1 mg Oral Daily  . hydrALAZINE  10 mg Oral 3 times per day  . multivitamin with minerals  1 tablet Oral Daily  . nicotine  21  mg Transdermal Daily  . sodium chloride  3 mL Intravenous Q12H  . sodium chloride  1 g Oral BID WC  . thiamine  100 mg Oral Daily   Or  . thiamine  100 mg Intravenous Daily   Impression/Plan 1. Hyponatremia - Very difficult situation in this patient. There is probably some chronicity (sodium low 130's in 2009), improved some with NS administration earlier in the admission (see table in HPI of original consult note)) though not to normal. 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 In addition he appeared volume depleted to me on exam at time of initial consultation and still looks dry. UOSM of 479 would fit with either vol depletion or SIADH or both. Sodium appeared to have started to trend upward based on labs done since starting the isotonic saline with KCl (for correction of volume depletion) but need additional labs.  If sodium stalls or starts to decline  again would then hang 500 ml of 3%saline to run at 30 cc/hour and continue with serial sodium measurements. (I have reordered Q6H lab draw) 2. Acute SDH and large parenchymal bleed due to mechanical fall - not felt surgical candidate - neuro is following, feels pt unchanged, and has made no additional recs at this time pending read of EEG   3. 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 HTN - getting prn meds IV 5. Chronic alcoholism 6. Malnutrition 7. DNR status   Camille Balynthia Elaiza Shoberg, MD [facility] [phone] pager 09/16/2014, 1:23 PM  Addendum: Serum sodium at 2:08 PM up to 122 from 117 (3:30 AM) . Rate of correction is about 0.45 mEq/L/hour which is acceptable.  Continue current NS. Camille Balynthia Vita Currin, MD [facility] [phone] Pager 09/16/2014, 3:43 PM

## 2014-09-16 NOTE — Consult Note (Signed)
Patient Mark [name]      DOB: 12/05/1950      KDT:[id number]     Consult Note from the Palliative Medicine Team at [facility] Requested by: Dr Doyle Askew    PCP: Leonard Downing, MD Reason for Consultation:GOC/? Of residential hospice    Phone Number:[phone]  Assessment/Recommendations: 63 yo male with PMHx of dementia, CVA's who presented with incarcerated hernia. Had fall post-op with resultant ICH.  Felt to be poor surgical candidate and family has elected to pursue full comfort measures.   1.  Code Status: DNR  2. Goals of Care: Discussed with Dr Doyle Askew who had meeting with family today along with Neurology.  They wish to pursue full comfort care. I personally did not have repeat in depth discussion today but confirmed with brother Mark [name] that they were interested in comfort care and possibly pursuing residential hospice placement.  In my opinion, Mark [name] would meet criteria for residential hospice though this decision is ultimately at the discretion of individual hospice agency.  Per bedside nursing he has not been eating and their is concern for aspiration.  He is at high risk for recurrent or evolving bleed which has already rebleed once. In addition with his decreased PO intake and severe hyponatremia, would suspect his electrolyte issues would continue to worsen. With this trajectory, I suspect his prognosis is most likely in the days to weeks range.  I have placed consult to SW for residential hospice (inpatient hospice at hospice facility). I did not discuss choice with patient and would certainly be reasonable to check with other hospice agencies outside of [place] if they do not have bed availability or does not meet their residential (inpatient hospice) criteria.    Would recommend the following regarding his meds: - would stop IV abx and oral vitamins.  Would d/c IVF. Do not suspect these would contribute to providing meaningful survival for patient if goal  is comfort.    3. Symptom Management: Fortunately he is not particularly symptomatic at this point.    1. Anxiety/Agitation: Will order PRN haldol. QTc normal 2. Pain: will order PRN morphine, though does not appear symptomatic currently  4. Psychosocial/Spiritual: Did not explore in depth as family just left hospital from meeting. Patient lived in [place]. Can consider Chaplain consult if family requests.      Brief HPI: Patient is a 64 yo male with PMHx of Dementia, CVA's who presented on 11/17 with complaint of weakness and falling. Initially, he was found to have incarcerated inguinal hernia for which he underwent operative repair.  Post-operatively, he had a fall in hospital with resultant ICH with CT evidence of evolving bleed in follow-up scans.  He is not able to verbalize appropriately currently.  Primary team and Neurology met with family today and ultimately decided together that focus should be on full comfort care. I was asked to weigh in on opinion of residential hospice (general inpatient hospice at hospice facility) appropriateness.     ROS: Unable to obtain with ICH and neurologic impairment.     PMH:  Past Medical History  Diagnosis Date  . Hypertension   . Stroke   . Dementia      PSH: Past Surgical History  Procedure Laterality Date  . Inguinal hernia repair Right 09/09/2014    Procedure: HERNIA REPAIR INGUINAL INCARCERATED with mesh;  Surgeon: Excell Seltzer, MD;  Location: [facility];  Service: General;  Laterality: Right;   I have reviewed the [facility] and SH and  If appropriate update it with new information. No Known Allergies Scheduled Meds: . antiseptic oral rinse  7 mL Mouth Rinse q12n4p  . cefTRIAXone (ROCEPHIN)  IV  1 g Intravenous Q24H  . chlorhexidine  15 mL Mouth Rinse BID  . feeding supplement (ENSURE)  1 Container Oral TID BM  . folic acid  1 mg Oral Daily  . hydrALAZINE  10 mg Oral 3 times per day  . multivitamin with minerals  1 tablet Oral  Daily  . nicotine  21 mg Transdermal Daily  . sodium chloride  3 mL Intravenous Q12H  . thiamine  100 mg Oral Daily   Or  . thiamine  100 mg Intravenous Daily   Continuous Infusions: . 0.9 % NaCl with KCl 40 mEq / L 100 mL/hr (09/15/14 1512)   PRN Meds:.acetaminophen **OR** acetaminophen, hydrALAZINE, ondansetron **OR** ondansetron (ZOFRAN) IV    BP 147/99 mmHg  Pulse 91  Temp(Src) 98.6 F (37 C) (Oral)  Resp 16  Ht $R'5\' 6"'HN$  (1.676 m)  Wt 49.7 kg (109 lb 9.1 oz)  BMI 17.69 kg/m2  SpO2 96%   PPS: 20   Intake/Output Summary (Last 24 hours) at 09/16/14 1359 Last data filed at 09/16/14 1353  Gross per 24 hour  Intake   2133 ml  Output   1100 ml  Net   1033 ml    Physical Exam:  General: Alert, NAD, unable to verbalize appropriately  HEENT:  [place], sclera anicteric Chest:   CTAB CVS: RRR Abdomen:soft, ND Ext: no edema Neuro: follows some very simple basic commands only. Unable to move LUE or LLE.    Labs: CBC    Component Value Date/Time   WBC 9.1 09/13/2014 0410   RBC 4.36 09/13/2014 0410   HGB 13.1 09/13/2014 0410   HCT 36.5* 09/13/2014 0410   PLT 416* 09/13/2014 0410   MCV 83.7 09/13/2014 0410   MCH 30.0 09/13/2014 0410   MCHC 35.9 09/13/2014 0410   RDW 12.2 09/13/2014 0410   LYMPHSABS 1.9 09/09/2014 0340   MONOABS 1.3* 09/09/2014 0340   EOSABS 0.1 09/09/2014 0340   BASOSABS 0.0 09/09/2014 0340    BMET    Component Value Date/Time   NA 117* 09/16/2014 0332   K 4.5 09/16/2014 0332   CL 84* 09/16/2014 0332   CO2 18* 09/16/2014 0332   GLUCOSE 107* 09/16/2014 0332   BUN 7 09/16/2014 0332   CREATININE 0.53 09/16/2014 0332   CALCIUM 8.7 09/16/2014 0332   GFRNONAA >90 09/16/2014 0332   GFRAA >90 09/16/2014 0332    CMP     Component Value Date/Time   NA 117* 09/16/2014 0332   K 4.5 09/16/2014 0332   CL 84* 09/16/2014 0332   CO2 18* 09/16/2014 0332   GLUCOSE 107* 09/16/2014 0332   BUN 7 09/16/2014 0332   CREATININE 0.53 09/16/2014 0332   CALCIUM  8.7 09/16/2014 0332   PROT 6.5 09/11/2014 0525   ALBUMIN 3.3* 09/11/2014 0525   AST 27 09/11/2014 0525   ALT 11 09/11/2014 0525   ALKPHOS 55 09/11/2014 0525   BILITOT 0.5 09/11/2014 0525   GFRNONAA >90 09/16/2014 0332   GFRAA >90 09/16/2014 0332  11/25 CT Head No change from yesterday. No new hemorrhage.  11/24 CT Head IMPRESSION: Large hematoma right temporoparietal lobe shows increased density compared to the prior study but is similar in size. This may indicate rebleeding in the interval. There is now blood in the right occipital horn which was not present previously.  Right hemispheric subdural hematoma slightly smaller now measuring approximately 2 mm in thickness. No shift of the midline structures.  11/23 CT Head IMPRESSION: 1. Examination is positive for acute right subdural hematoma and large right temporal lobe parenchymal hematoma.   11/20 CXR IMPRESSION: Mild lung hyperexpansion without acute cardiopulmonary disease.  11/17 CT Head IMPRESSION: Atrophy with small vessel chronic ischemic changes of deep cerebral white matter.  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  No definite acute intracranial abnormalities.   11/17 CT ABD/Pelvis IMPRESSION: RIGHT inguinal hernia containing small bowel loops.  Associated dilatation of intra-abdominal small bowel loops compatible with small bowel obstruction secondary to RIGHT inguinal hernia, with some of a small bowel loops proximal to the hernia demonstrating hyperemia and minimal wall thickening suggesting incarceration.  No gross evidence of perforation or abscess on exam limited by patient motion.  Extensive atherosclerotic disease.    Total Time: 40 minutes  Greater than 50%  of this time was spent counseling and coordinating care related to the above assessment and plan.  Doran Clay D.O. Palliative Medicine Team at [facility]  Pager: [phone] Team Phone:  [phone]

## 2014-09-16 NOTE — Procedures (Signed)
ELECTROENCEPHALOGRAM REPORT   Patient: Mark [name]       Room #: [id number] EEG No. ID: [id number] Age: 63 y.o.        Sex: male Referring Physician: Izola PriceMyers Report Date:  09/16/2014        Interpreting Physician: Thana FarrEYNOLDS,Alecxander Mainwaring D  History: Mark [name] is an 63 y.o. male with right SDH and intraparenchymal hemorrhage and decreased level of consciousness  Medications:  Scheduled: . antiseptic oral rinse  7 mL Mouth Rinse q12n4p  . cefTRIAXone (ROCEPHIN)  IV  1 g Intravenous Q24H  . chlorhexidine  15 mL Mouth Rinse BID  . feeding supplement (ENSURE)  1 Container Oral TID BM  . folic acid  1 mg Oral Daily  . hydrALAZINE  10 mg Oral 3 times per day  . multivitamin with minerals  1 tablet Oral Daily  . nicotine  21 mg Transdermal Daily  . sodium chloride  3 mL Intravenous Q12H  . thiamine  100 mg Oral Daily   Or  . thiamine  100 mg Intravenous Daily    Conditions of Recording:  This is a 16 channel EEG carried out with the patient in the obtunded state.  Description:  The background activity is asymmetric.  Over the left hemisphere the background activity suggests normal stage II sleep.  The background is slow and poorly organized with superimposed sleep spindles.  With alerting the spindle activity is no longer presetn but the background activity remains slow with a maximum posterior background rhythm of 6 Hz theta.   Over the right hemisphere the background activity is attenuated.  Again the activity is poorly organized and consists mostly of a polymorphic delta activity.  No epileptiform activity is noted.   Hyperventilation and intermittent photic stimulation were not performed.  IMPRESSION: This is an abnormal electroencephalogram.  Activity over the right hemisphere is attenuated and slow consistent with the patient's history of a right intraparenchymal hemorrhage.  No epileptiform activity is noted.     Thana FarrLeslie Berdie Malter, MD [facility] [phone] 09/16/2014,  4:12 PM

## 2014-09-16 NOTE — Plan of Care (Signed)
Problem: Phase II Progression Outcomes Goal: Sutures/staples intact Outcome: Completed/Met Date Met:  09/16/14     

## 2014-09-16 NOTE — Procedures (Deleted)
I have seen and examined this patient and agree with the plan of care . Patient seen on dialysis comfortable and no complaints BP 130/80  Mark [name] W 09/16/2014, 8:40 AM

## 2014-09-16 NOTE — Progress Notes (Addendum)
CSW continuing to follow.  CSW reviewed chart and noted new referral for residential hospice placement.  CSW contacted pt sister, Trula SladeJudy Atkinson via telephone but unable to reach. CSW contacted her husband, pt brother-in-law, Iran PlanasBen Atkinson via telephone. CSW spoke with pt brother-in-law, Romeo AppleBen who reports that he spoke with PMT MD, Dr. Greig RightLampkin via telephone today. CSW discussed with pt brother-in-law recommendation for residential hospice. Pt brother-in-law expressed understanding. CSW offered choice and [facility]  [facility], Forrestine Himva Davis. CSW to await [facility] to process referral request.  CSW to continue to follow to provide support and assist with pt disposition planning.  Addendum 4:10 pm:  [facility] received [facility], Forrestine Himva Davis [facility].   CSW contacted pt brother-in-law, Iran PlanasBen Atkinson to notify. Per pt brother-in-law, pt is now going to be evaluated by another neurologist to determine if pt a candidate for any type of surgical intervention. Pt brother-in-law would prefer for CSW to follow up on Friday in hopes that pt family will have a determination from neurologist by that time. Pt brother-in-law aware that [facility] and residential hospice remains recommendation for discharge then pt family will need to explore other options for residential hospice facilities.   CSW confirmed with RN that pt is to be evaluated by another neurologist regarding if pt surgical candidate.  CSW to continue to follow to provide support and assist with pt disposition planning.   Loletta SpecterSuzanna Ricca Melgarejo, MSW, LCSW Clinical Social Work [phone]

## 2014-09-16 NOTE — Progress Notes (Signed)
EEG Completed; Results Pending  

## 2014-09-16 NOTE — Progress Notes (Signed)
OT Cancellation Note  Patient Details Name: Mark BilberryMichael [name] MRN: [id number] DOB: 09/21/1951   Cancelled Treatment:    Reason Eval/Treat Not Completed: Medical issues which prohibited therapy - Pt with hyponatremia and progression of SDH with decline in mental status.   Will reattempt as medically appropriate.   Angelene GiovanniConarpe, Yoni Lobos M  Symia Herdt Franklinonarpe, OTR/L [phone]  09/16/2014, 11:28 AM

## 2014-09-16 NOTE — Progress Notes (Addendum)
Subjective: Patient unchanged for the most part from yesterday.  Nonverbal today but otherwise unchanged.  Has been monitored on stepdown but family wishes are unchanged and patient remains DNR.    Objective: Current vital signs: BP 152/72 mmHg  Pulse 108  Temp(Src) 99 F (37.2 C) (Oral)  Resp 15  Ht 5\' 6"  (1.676 m)  Wt 49.7 kg (109 lb 9.1 oz)  BMI 17.69 kg/m2  SpO2 98% Vital signs in last 24 hours: Temp:  [97.3 F (36.3 C)-99.1 F (37.3 C)] 99 F (37.2 C) (11/25 0726) Pulse Rate:  [92-119] 108 (11/25 0600) Resp:  [15-24] 15 (11/25 0600) BP: (141-180)/(61-93) 152/72 mmHg (11/25 0605) SpO2:  [91 %-99 %] 98 % (11/25 0600) Weight:  [49.7 kg (109 lb 9.1 oz)] 49.7 kg (109 lb 9.1 oz) (11/25 0600)  Intake/Output from previous day: 11/24 0701 - 11/25 0700 In: 1823 [P.O.:240; I.V.:1483; IV Piggyback:100] Out: 450 [Urine:450] Intake/Output this shift: Total I/O In: 403 [I.V.:403] Out: -  Nutritional status: DIET SOFT  Neurologic Exam: Mental Status: Obtunded. No speech. Did nod his head once to questioning.  Does not follow commands. Cranial Nerves: II: Discs flat bilaterally; Does not blink to confrontation from the left. Appears to blink from the right, pupils equal, round. Right briskly reactive. Left sluggish.  III,IV, VI: ptosis not present, with oculocephalic maneuver patient does not cross midline to the left V,VII: left facial droop VIII: hearing normal bilaterally IX,X: gag reflex reduced XI: shoulder shrug decreased on the left XII: Unable to perform Motor: Right :Upper extremity 5/5Left: Upper extremity 0/5 Lower extremity 5/5Lower extremity 0/5 Tone and bulk:normal tone throughout; no atrophy noted Sensory: Does not respond to noxious stimuli throughout Deep Tendon Reflexes: 2+ and symmetric with absent AJ's bilaterally Plantars: Right:  upgoingLeft: upgoing Cerebellar: Unable to perform  Lab Results: Basic Metabolic Panel:  Recent Labs Lab 09/14/14 2057 09/15/14 0400 09/15/14 1220 09/15/14 1952 09/16/14 0332  NA 116* 118* 115* 116* 117*  K 4.7 4.0 4.3 4.6 4.5  CL 82* 83* 80* 82* 84*  CO2 21 20 20 19  18*  GLUCOSE 104* 104* 115* 100* 107*  BUN 7 7 8 8 7   CREATININE 0.55 0.51 0.56 0.57 0.53  CALCIUM 8.9 8.8 8.9 9.1 8.7    Liver Function Tests:  Recent Labs Lab 09/11/14 0525  AST 27  ALT 11  ALKPHOS 55  BILITOT 0.5  PROT 6.5  ALBUMIN 3.3*   No results for input(s): LIPASE, AMYLASE in the last 168 hours. No results for input(s): AMMONIA in the last 168 hours.  CBC:  Recent Labs Lab 09/10/14 1000 09/11/14 0525 09/12/14 0347 09/13/14 0410  WBC 11.6* 12.1* 11.3* 9.1  HGB 11.9* 13.5 13.1 13.1  HCT 33.4* 37.2* 36.9* 36.5*  MCV 85.6 85.3 84.2 83.7  PLT 317 396 431* 416*    Cardiac Enzymes: No results for input(s): CKTOTAL, CKMB, CKMBINDEX, TROPONINI in the last 168 hours.  Lipid Panel: No results for input(s): CHOL, TRIG, HDL, CHOLHDL, VLDL, LDLCALC in the last 168 hours.  CBG:  Recent Labs Lab 09/15/14 1659 09/15/14 2009 09/16/14 0013 09/16/14 0352 09/16/14 0807  GLUCAP 108* 99 106* 112* 99    Microbiology: Results for orders placed or performed during the hospital encounter of 09/08/14  MRSA PCR Screening     Status: None   Collection Time: 09/09/14  3:08 AM  Result Value Ref Range Status   MRSA by PCR NEGATIVE NEGATIVE Final    Comment:  The GeneXpert MRSA Assay (FDA approved for NASAL specimens only), is one component of a comprehensive MRSA colonization surveillance program. It is not intended to diagnose MRSA infection nor to guide or monitor treatment for MRSA infections.   Urine culture     Status: None   Collection Time: 09/11/14  9:04 AM  Result Value Ref Range Status   Specimen Description URINE, RANDOM  Final   Special  Requests NONE  Final   Culture  Setup Time   Final    09/11/2014 11:48 Performed at [facility]    Colony Count NO GROWTH Performed at [facility]   Final   Culture NO GROWTH Performed at [facility]   Final   Report Status 09/12/2014 FINAL  Final  Culture, Urine     Status: None   Collection Time: 09/12/14 10:46 AM  Result Value Ref Range Status   Specimen Description URINE, CATHETERIZED  Final   Special Requests NONE  Final   Culture  Setup Time   Final    09/12/2014 19:00 Performed at [facility]    Colony Count NO GROWTH Performed at [facility]   Final   Culture NO GROWTH Performed at [facility]   Final   Report Status 09/13/2014 FINAL  Final    Coagulation Studies:  Recent Labs  09/15/14 1952  LABPROT 14.2  INR 1.09    Imaging: Ct Head Wo Contrast  09/15/2014   CLINICAL DATA:  Followup subdural hematoma.  EXAM: CT HEAD WITHOUT CONTRAST  TECHNIQUE: Contiguous axial images were obtained from the base of the skull through the vertex without intravenous contrast.  COMPARISON:  CT head 09/14/2014  FINDINGS: Image quality degraded by significant motion. The entire study was repeated due to motion.  Large high-density acute hematoma in the right temporoparietal lobe measures approximately 6 x 3.3 cm. This is similar in size however shows increased density compared with the prior study suggesting interval rebleeding. In addition there is now blood layering in the occipital horns which was not present previously.  Thin high density subdural hematoma over the right temporal parietal lobe slightly smaller compared with the prior study.  Diffuse atrophy. Ventricular enlargement is stable and consistent with atrophy.  Advanced chronic microvascular ischemic change. Chronic infarct left thalamus and right cerebellum. Chronic occipital pole infarcts bilaterally. No acute ischemic infarct. Negative for mass lesion.  IMPRESSION:  Large hematoma right temporoparietal lobe shows increased density compared to the prior study but is similar in size. This may indicate rebleeding in the interval. There is now blood in the right occipital horn which was not present previously.  Right hemispheric subdural hematoma slightly smaller now measuring approximately 2 mm in thickness. No shift of the midline structures.  These results will be called to the ordering clinician or representative by the Radiologist Assistant, and communication documented in the PACS or zVision Dashboard.   Electronically Signed   By: Marlan Palauharles  [name] M.D.   On: 09/15/2014 14:36   Ct Head Wo Contrast  09/14/2014   ADDENDUM REPORT: 09/14/2014 20:10  ADDENDUM: Critical Value/emergent results were called by telephone at the time of interpretation on 09/14/2014 at 8:10 pm to Fabian Novemberatherine Shorr, NP, who verbally acknowledged these results.   Electronically Signed   By: Signa Kellaylor  Stroud M.D.   On: 09/14/2014 20:10   09/14/2014   CLINICAL DATA:  Larey SeatFell from bed.  Hit head.  EXAM: CT HEAD WITHOUT CONTRAST  TECHNIQUE: Contiguous axial images were obtained from  the base of the skull through the vertex without intravenous contrast.  COMPARISON:  09/08/2014.  FINDINGS: There is a large acute parenchymal hematoma within the right temporal lobe measuring 4.2 x 5.7 cm, image 19/series 2. There is a subdural hematoma overlying the right cerebral hemisphere which measures 5 mm, image 21/series 2. Prominence of the sulci and ventricles compatible with brain atrophy. There is diffuse low attenuation throughout the subcortical and periventricular white matter compatible with chronic microvascular disease. Multiple, old bilateral basal ganglia and thalamic infarcts are identified. Calcified atherosclerotic plaque is identified within the right middle cerebral artery. The paranasal sinuses are clear. The mastoid air cells are clear. The calvarium appears intact.  IMPRESSION: 1. Examination is positive  for acute right subdural hematoma and large right temporal lobe parenchymal hematoma.  Electronically Signed: By: Signa Kellaylor  Stroud M.D. On: 09/14/2014 20:03    Medications:  I have reviewed the patient's current medications. Scheduled: . antiseptic oral rinse  7 mL Mouth Rinse q12n4p  . cefTRIAXone (ROCEPHIN)  IV  1 g Intravenous Q24H  . chlorhexidine  15 mL Mouth Rinse BID  . feeding supplement (ENSURE)  1 Container Oral TID BM  . folic acid  1 mg Oral Daily  . hydrALAZINE  10 mg Oral 3 times per day  . multivitamin with minerals  1 tablet Oral Daily  . nicotine  21 mg Transdermal Daily  . sodium chloride  3 mL Intravenous Q12H  . sodium chloride  1 g Oral BID WC  . thiamine  100 mg Oral Daily   Or  . thiamine  100 mg Intravenous Daily    Assessment/Plan: Clinically patient is unchanged.  Head CT from today personally reviewed and shows no significant change from the previous imaging.  Patient remains DNR.  EEG pending.  INR normal.  Platelet count normal.  Recommendations: 1.  Supportive care.   2.  Will follow up results of EEG and initiate treatment if indicated.    Case discussed with Dr. Izola PriceMyers    LOS: 8 days   Thana FarrLeslie Drue Harr, MD [facility] [phone] 09/16/2014  12:18 PM

## 2014-09-16 NOTE — Progress Notes (Signed)
PT Cancellation Note  Patient Details Name: Mark BilberryMichael [name] MRN: [id number] DOB: [birth year]   Cancelled Treatment:    Reason Eval/Treat Not Completed: Medical issues which prohibited therapy (moved to ICU, panic Na+ level)   Rada HayHill, [name] 09/16/2014, 8:23 AM Blanchard KelchKaren Marisela Line PT [phone]

## 2014-09-16 NOTE — Progress Notes (Signed)
Sitter called RN to room. States patient began shaking all over, lasting about one to one and a half minutes. MD on call notified. Vitals WNL. Neuro assessment complete. Will continue to monitor.

## 2014-09-16 NOTE — Consult Note (Signed)
[facility] Liaison: Received request from CSW [facility] Made CSW currently no [facility]. Will follow up with CSW if availability changes. Thank you. Forrestine Himva Adrick Kestler LCSW [phone]

## 2014-09-16 NOTE — Progress Notes (Addendum)
Patient ID: Mark [name], male   DOB: 22-Mar-1951, 63 y.o.   MRN: [id number]  [facility] PROGRESS NOTE  Mark [name] [id number]:[id number] DOB: 22-Mar-1951 DOA: 09/08/2014 PCP: [name],[name], MD  Brief narrative: 63 y.o. male history of hypertension and alcoholism, previous stroke was brought to the ER after patient was found to have frequent falls. Patient lives with his friend in an apartment and is frequently checked by his sister. Patient's sister stated that patient was recently diagnosed with dementia and is largely dependent on others for his daily living activities. Over the last 1 week patient's friend noted that patient has been having frequent falls but did not lose consciousness. So patient was brought to the ER. In the ER CT of the head did not show anything acute and on exam patient had large right inguinal hernia which was not reducible and CT abdomen and pelvis showed features concerning for obstruction, on-call surgeon was consulted. patient noted to have hyponatremia on presentation with Na of 117.  Pt s/p inguinal hernia repair by Dr. [name] 09/09/2014. Hospital course complicated by slow recovery, persistent hyponatremia, fall with acute rt subdural and left large intraparenchymal bleed with encephalopathy.   Assessment and Plan:   Principal Problem:   Fall with rt subdural and left large parenchymal bleed on 11/23 - Secondary to mechanical fall, hit his head on the right, CT head this AM with no new changes - d/w Dr. [name] and he explained that CT finding notable for intraparenchymal bleed of this type, especially since not causing mass effect, does not require surgical intervention (regardless of underlying medical conditions) and may spontaneously resolve over period of time, it is also not life threatening but can unfortunately become worse at which point surgical intervention would still not be indicated  - right subdural hematoma is actually smaller and  Dr. [name] also explained it does not require an intervention  - recommendation is to continue monitoring, PT as pt able to tolerate and focus of further fall prevention  - d/w with brother and answered his questions    Hyponatremia - possibly chronic component , Na was 117 on presentation. 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 D/w Dr Eliott Nineunham . 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 - Cortisol and TSH normal. Na is slightly better this AM - appreciate Dr. [name]'s assistance  - renal panel in AM  Incarcerated inguinal hernia - status post repair with mesh, post op day #7 - was tolerating advanced diet, now with MS changes with minimal oral intake  - continue to clean site with soap and water, no need for dressing, keep surgical area dry and clean  Active Problems: Encephalopathy - Multifactorial, post op, UTI and now with cerebral bleed. - Non verbal this AM but can be aroused  - certainly hopeful he will improve, continue to monitor and if any progress made, may be OK for D/C to SNF once medically stable   Hypokalemia - supplemented   Hypertension - stable. Prn hydralazine  Leucocytosis - resolved. Secondary to incarcerated hernia and UTI. - continue rocephin   Hx of heavy Alcoholism -No signs of withdrawal   Severe PCM - feed as pt able to tolerate   Acute functional quadriplegia - Seen by PT/OT. Remains inpatient for now   DVT prophylaxis  SCD's  Code Status: DNR Family Communication: Spoke with pt's brother over the phone  Disposition Plan: Remains inpatient   IV Access:   Peripheral  IV Procedures and diagnostic studies:   Ct Head Wo Contrast  09/15/2014  Large hematoma right temporoparietal lobe shows increased density compared to the prior study but is similar in size. This may indicate rebleeding in the interval. There is now blood in the right occipital horn which was not  present previously.  Right hemispheric subdural hematoma slightly smaller now measuring approximately 2 mm in thickness. No shift of the midline structures.    Ct Head Wo Contrast  09/14/2014  Examination is positive for acute right subdural hematoma and large right temporal lobe parenchymal hematoma.   Medical Consultants:   Neurology Nephrology  Neurosurgery  PCT Anti-Infectives:   None   [name], MD  [facility] Pager [phone]  If 7PM-7AM, please contact night-coverage [email or web address] Password [facility] 09/16/2014, 10:53 AM   LOS: 8 days   HPI/Subjective: No events overnight.   Objective: Filed Vitals:   09/16/14 0500 09/16/14 0600 09/16/14 0605 09/16/14 0726  BP:  152/72 152/72   Pulse: 104 108    Temp:    99 F (37.2 C)  TempSrc:    Oral  Resp: 22 15    Height:      Weight:  49.7 kg (109 lb 9.1 oz)    SpO2: 98% 98%      Intake/Output Summary (Last 24 hours) at 09/16/14 1053 Last data filed at 09/16/14 0940  Gross per 24 hour  Intake   1580 ml  Output    450 ml  Net   1130 ml    Exam:   General:  Pt is non verbal, opens eyes with sternal rub   Cardiovascular: Regular rhythm, tachycardic, S1/S2, no murmurs, no rubs, no gallops  Respiratory: Clear to auscultation bilaterally, diminished breath sounds at bases   Abdomen: Soft, non tender, non distended, bowel sounds present, no guarding  Extremities: pulses DP and PT palpable bilaterally  Data Reviewed: Basic Metabolic Panel:  Recent Labs Lab 09/14/14 2057 09/15/14 0400 09/15/14 1220 09/15/14 1952 09/16/14 0332  NA 116* 118* 115* 116* 117*  K 4.7 4.0 4.3 4.6 4.5  CL 82* 83* 80* 82* 84*  CO2 21 20 20 19  18*  GLUCOSE 104* 104* 115* 100* 107*  BUN 7 7 8 8 7   CREATININE 0.55 0.51 0.56 0.57 0.53  CALCIUM 8.9 8.8 8.9 9.1 8.7   Liver Function Tests:  Recent Labs Lab 09/11/14 0525  AST 27  ALT 11  ALKPHOS 55  BILITOT 0.5  PROT 6.5  ALBUMIN 3.3*   CBC:  Recent Labs Lab 09/10/14 1000  09/11/14 0525 09/12/14 0347 09/13/14 0410  WBC 11.6* 12.1* 11.3* 9.1  HGB 11.9* 13.5 13.1 13.1  HCT 33.4* 37.2* 36.9* 36.5*  MCV 85.6 85.3 84.2 83.7  PLT 317 396 431* 416*   CBG:  Recent Labs Lab 09/15/14 1659 09/15/14 2009 09/16/14 0013 09/16/14 0352 09/16/14 0807  GLUCAP 108* 99 106* 112* 99    Recent Results (from the past 240 hour(s))  MRSA PCR Screening     Status: None   Collection Time: 09/09/14  3:08 AM  Result Value Ref Range Status   MRSA by PCR NEGATIVE NEGATIVE Final    Comment:        The GeneXpert MRSA Assay (FDA approved for NASAL specimens only), is one component of a comprehensive MRSA colonization surveillance program. It is not intended to diagnose MRSA infection nor to guide or monitor treatment for MRSA infections.   Urine culture     Status: None   Collection Time:  09/11/14  9:04 AM  Result Value Ref Range Status   Specimen Description URINE, RANDOM  Final   Special Requests NONE  Final   Culture  Setup Time   Final    09/11/2014 11:48 Performed at [facility]    Colony Count NO GROWTH Performed at [facility]   Final   Culture NO GROWTH Performed at [facility]   Final   Report Status 09/12/2014 FINAL  Final  Culture, Urine     Status: None   Collection Time: 09/12/14 10:46 AM  Result Value Ref Range Status   Specimen Description URINE, CATHETERIZED  Final   Special Requests NONE  Final   Culture  Setup Time   Final    09/12/2014 19:00 Performed at [facility]    Colony Count NO GROWTH Performed at [facility]   Final   Culture NO GROWTH Performed at [facility]   Final   Report Status 09/13/2014 FINAL  Final     Scheduled Meds: . antiseptic oral rinse  7 mL Mouth Rinse q12n4p  . cefTRIAXone (ROCEPHIN)  IV  1 g Intravenous Q24H  . chlorhexidine  15 mL Mouth Rinse BID  . feeding supplement (ENSURE)  1 Container Oral TID BM  . folic acid  1 mg Oral Daily  .  hydrALAZINE  10 mg Oral 3 times per day  . multivitamin with minerals  1 tablet Oral Daily  . nicotine  21 mg Transdermal Daily  . sodium chloride  3 mL Intravenous Q12H  . sodium chloride  1 g Oral BID WC  . thiamine  100 mg Oral Daily   Or  . thiamine  100 mg Intravenous Daily   Continuous Infusions: . 0.9 % NaCl with KCl 40 mEq / L 100 mL/hr (09/15/14 1512)

## 2014-09-16 NOTE — Progress Notes (Signed)
CRITICAL VALUE ALERT  Critical value received:  Na+ 117  Date of notification:  11/25  Time of notification:  0445  Critical value read back:Yes.    Nurse who received alert:  Max SaneMackenzie Xylan Sheils, RN  MD notified:  MD aware

## 2014-09-17 LAB — RENAL FUNCTION PANEL
ALBUMIN: 3 g/dL — AB (ref 3.5–5.2)
ALBUMIN: 2.9 g/dL — AB (ref 3.5–5.2)
ANION GAP: 14 (ref 5–15)
Albumin: 3 g/dL — ABNORMAL LOW (ref 3.5–5.2)
Albumin: 3 g/dL — ABNORMAL LOW (ref 3.5–5.2)
Anion gap: 11 (ref 5–15)
Anion gap: 14 (ref 5–15)
Anion gap: 14 (ref 5–15)
BUN: 7 mg/dL (ref 6–23)
BUN: 6 mg/dL (ref 6–23)
BUN: 6 mg/dL (ref 6–23)
BUN: 6 mg/dL (ref 6–23)
CALCIUM: 8.6 mg/dL (ref 8.4–10.5)
CALCIUM: 8.6 mg/dL (ref 8.4–10.5)
CO2: 21 mEq/L (ref 19–32)
CO2: 19 mEq/L (ref 19–32)
CO2: 20 mEq/L (ref 19–32)
CO2: 21 meq/L (ref 19–32)
CREATININE: 0.49 mg/dL — AB (ref 0.50–1.35)
CREATININE: 0.51 mg/dL (ref 0.50–1.35)
Calcium: 8.7 mg/dL (ref 8.4–10.5)
Calcium: 8.5 mg/dL (ref 8.4–10.5)
Chloride: 90 mEq/L — ABNORMAL LOW (ref 96–112)
Chloride: 88 mEq/L — ABNORMAL LOW (ref 96–112)
Chloride: 88 mEq/L — ABNORMAL LOW (ref 96–112)
Chloride: 86 mEq/L — ABNORMAL LOW (ref 96–112)
Creatinine, Ser: 0.53 mg/dL (ref 0.50–1.35)
Creatinine, Ser: 0.53 mg/dL (ref 0.50–1.35)
GFR calc Af Amer: >90 mL/min (ref >90)
GFR calc Af Amer: >90 mL/min (ref >90)
GFR calc Af Amer: >90 mL/min (ref >90)
GFR calc Af Amer: >90 mL/min (ref >90)
GFR calc non Af Amer: >90 mL/min (ref >90)
GFR calc non Af Amer: >90 mL/min (ref >90)
GFR calc non Af Amer: >90 mL/min (ref >90)
GFR calc non Af Amer: >90 mL/min (ref >90)
GLUCOSE: 94 mg/dL (ref 70–99)
GLUCOSE: 77 mg/dL (ref 70–99)
Glucose, Bld: 87 mg/dL (ref 70–99)
Glucose, Bld: 88 mg/dL (ref 70–99)
PHOSPHORUS: 3 mg/dL (ref 2.3–4.6)
POTASSIUM: 4.8 meq/L (ref 3.7–5.3)
POTASSIUM: 4.5 meq/L (ref 3.7–5.3)
Phosphorus: 2.6 mg/dL (ref 2.3–4.6)
Phosphorus: 2.8 mg/dL (ref 2.3–4.6)
Phosphorus: 2.7 mg/dL (ref 2.3–4.6)
Potassium: 4.5 mEq/L (ref 3.7–5.3)
Potassium: 4.2 mEq/L (ref 3.7–5.3)
Sodium: 122 mEq/L — ABNORMAL LOW (ref 137–147)
Sodium: 121 mEq/L — CL (ref 137–147)
Sodium: 122 mEq/L — ABNORMAL LOW (ref 137–147)
Sodium: 121 mEq/L — CL (ref 137–147)

## 2014-09-17 LAB — GLUCOSE, CAPILLARY
GLUCOSE-CAPILLARY: 105 mg/dL — AB (ref 70–99)
GLUCOSE-CAPILLARY: 99 mg/dL (ref 70–99)
GLUCOSE-CAPILLARY: 101 mg/dL — AB (ref 70–99)
GLUCOSE-CAPILLARY: 86 mg/dL (ref 70–99)
GLUCOSE-CAPILLARY: 92 mg/dL (ref 70–99)
Glucose-Capillary: 78 mg/dL (ref 70–99)
Glucose-Capillary: 80 mg/dL (ref 70–99)
Glucose-Capillary: 89 mg/dL (ref 70–99)

## 2014-09-17 LAB — URINALYSIS, ROUTINE W REFLEX MICROSCOPIC
Bilirubin Urine: NEGATIVE
GLUCOSE, UA: NEGATIVE mg/dL
Hgb urine dipstick: NEGATIVE
LEUKOCYTES UA: NEGATIVE
Nitrite: NEGATIVE
PROTEIN: NEGATIVE mg/dL
Specific Gravity, Urine: 1.018 (ref 1.005–1.030)
UROBILINOGEN UA: 0.2 mg/dL (ref 0.0–1.0)
pH: 5.5 (ref 5.0–8.0)

## 2014-09-17 LAB — CBC
HCT: 31.5 % — ABNORMAL LOW (ref 39.0–52.0)
Hemoglobin: 11.1 g/dL — ABNORMAL LOW (ref 13.0–17.0)
MCH: 30.5 pg (ref 26.0–34.0)
MCHC: 35.2 g/dL (ref 30.0–36.0)
MCV: 86.5 fL (ref 78.0–100.0)
Platelets: 413 10*3/uL — ABNORMAL HIGH (ref 150–400)
RBC: 3.64 MIL/uL — ABNORMAL LOW (ref 4.22–5.81)
RDW: 12.4 % (ref 11.5–15.5)
WBC: 19.4 10*3/uL — ABNORMAL HIGH (ref 4.0–10.5)

## 2014-09-17 NOTE — Progress Notes (Signed)
Patient ID: Mark BilberryMichael [name], male   DOB: 26-Aug-1951, 63 y.o.   MRN: [id number]  [facility] PROGRESS NOTE  Mark BilberryMichael [name] [id number] DOB: 26-Aug-1951 DOA: 09/08/2014 PCP: [name],[name], MD   Brief narrative: 63 y.o. male history of hypertension and alcoholism, previous stroke was brought to the ER after patient was found to have frequent falls. Patient lives with his friend in an apartment and is frequently checked by his sister. Patient's sister stated that patient was recently diagnosed with dementia and is largely dependent on others for his daily living activities. Over the last 1 week patient's friend noted that patient has been having frequent falls but did not lose consciousness. So patient was brought to the ER. In the ER CT of the head did not show anything acute and on exam patient had large right inguinal hernia which was not reducible and CT abdomen and pelvis showed features concerning for obstruction, on-call surgeon was consulted. patient noted to have hyponatremia on presentation with Na of 117.  Pt s/p inguinal hernia repair by Dr. [name] 09/09/2014. Hospital course complicated by slow recovery, persistent hyponatremia, fall with acute rt subdural and left large intraparenchymal bleed with encephalopathy.   Assessment and Plan:   Principal Problem:  Fall with rt subdural and left large parenchymal bleed on 11/23 - Secondary to mechanical fall, hit his head on the right, CT head this AM with no new changes - d/w Dr. [name] and he explained that CT finding notable for intraparenchymal bleed of this type, especially since not causing mass effect, does not require surgical intervention (regardless of underlying medical conditions) and may spontaneously resolve over period of time, it is also not life threatening but can unfortunately become worse at which point surgical intervention would still not be indicated  - right subdural hematoma is actually smaller  and Dr. [name] also explained it does not require an intervention  - recommendation is to continue monitoring, PT as pt able to tolerate and focus of further fall prevention  - d/w with brother and answered his questions  - pt is more alert this AM and follows some commands, able to tell me his name   Hyponatremia - possibly chronic component , Na was 117 on presentation. 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 D/w Dr Eliott Nineunham . 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 - Cortisol and TSH normal. Na trending over the past 48 hours: 116 --> 117 --> 122 -->  - appreciate Dr. [name]'s assistance  - monitor   Incarcerated inguinal hernia - status post repair with mesh, post op day #8 - more alert this AM, wants to eat, will assist with feeding  - continue to clean site with soap and water, no need for dressing, keep surgical area dry and clean   Fever, Tmax 100.6 F - overnight 11/25, will monitor closely, may need CXR and UA if fever persists  - pt currently denies shortness of breath Active Problems: Encephalopathy - Multifactorial, post op, UTI and now with cerebral bleed. - appears to be better this AM  - certainly hopeful he will improve, continue to monitor and if any progress made, may be OK for D/C to SNF once medically stable   Hypokalemia - supplemented   Hypertension - stable. Prn hydralazine  Leucocytosis - resolved. Secondary to incarcerated hernia and UTI. - since with fever overnight will check CBC now  - continue rocephin   Hx of heavy Alcoholism -No signs  of withdrawal  Severe PCM - feed as pt able to tolerate   Acute functional quadriplegia - Seen by PT/OT. Remains inpatient for now   DVT prophylaxis  SCD's  Code Status: DNR Family Communication: Spoke with pt's brother over the phone  Disposition Plan: Remains inpatient   IV Access:    Peripheral  IV Procedures and diagnostic studies:    Ct Head Wo Contrast 09/15/2014 Large hematoma right temporoparietal lobe shows increased density compared to the prior study but is similar in size. This may indicate rebleeding in the interval. There is now blood in the right occipital horn which was not present previously. Right hemispheric subdural hematoma slightly smaller now measuring approximately 2 mm in thickness. No shift of the midline structures.   Ct Head Wo Contrast 09/14/2014 Examination is positive for acute right subdural hematoma and large right temporal lobe parenchymal hematoma. Medical Consultants:    Neurology  Nephrology   Neurosurgery   PCT Anti-Infectives:    None  [name], MD  [facility] Pager [phone]  If 7PM-7AM, please contact night-coverage [email or web address] Password [email or web address] 09/17/2014, 7:45 AM   LOS: 9 days   HPI/Subjective: No events overnight.   Objective: Filed Vitals:   09/16/14 2000 09/16/14 2050 09/16/14 2209 09/17/14 0429  BP: 145/74 166/86 155/82 152/85  Pulse: 87 106 110 107  Temp:  100.6 F (38.1 C) 99.3 F (37.4 C) 98.4 F (36.9 C)  TempSrc:  Oral Oral Oral  Resp: 15 16  20   Height:  5\' 6"  (1.676 m)    Weight:  50.667 kg (111 lb 11.2 oz)    SpO2: 98% 98% 95% 96%    Intake/Output Summary (Last 24 hours) at 09/17/14 0745 Last data filed at 09/17/14 4540  Gross per 24 hour  Intake   1923 ml  Output   1300 ml  Net    623 ml    Exam:   General:  Pt is more alert but very frail, able to follow some commands, NAD  Cardiovascular: Regular rhythm, tachycardic, S1/S2, no murmurs, no rubs, no gallops  Respiratory: Clear to auscultation bilaterally, no wheezing, diminished breath sounds at bases   Abdomen: Soft, non tender, non distended, bowel sounds present, no guarding  Extremities: pulses DP and PT palpable bilaterally  Data Reviewed: Basic Metabolic Panel:  Recent Labs Lab 09/15/14 1952 09/16/14 0332  09/16/14 1408 09/16/14 1950 09/17/14 0145  NA 116* 117* 122* 117* 122*  K 4.6 4.5 4.6 4.6 4.8  CL 82* 84* 87* 86* 90*  CO2 19 18* 20 19 21   GLUCOSE 100* 107* 97 89 94  BUN 8 7 7 7 7   CREATININE 0.57 0.53 0.56 0.54 0.53  CALCIUM 9.1 8.7 9.0 8.7 8.7  PHOS  --   --  2.9 2.7 2.6   Liver Function Tests:  Recent Labs Lab 09/11/14 0525 09/16/14 1408 09/16/14 1950 09/17/14 0145  AST 27  --   --   --   ALT 11  --   --   --   ALKPHOS 55  --   --   --   BILITOT 0.5  --   --   --   PROT 6.5  --   --   --   ALBUMIN 3.3* 3.2* 3.0* 3.0*   CBC:  Recent Labs Lab 09/10/14 1000 09/11/14 0525 09/12/14 0347 09/13/14 0410  WBC 11.6* 12.1* 11.3* 9.1  HGB 11.9* 13.5 13.1 13.1  HCT 33.4* 37.2* 36.9* 36.5*  MCV 85.6 85.3 84.2 83.7  PLT 317 396 431* 416*   CBG:  Recent Labs Lab 09/16/14 1938 09/16/14 2044 09/16/14 2356 09/17/14 0425 09/17/14 0738  GLUCAP 99 90 111* 101* 86    Recent Results (from the past 240 hour(s))  MRSA PCR Screening     Status: None   Collection Time: 09/09/14  3:08 AM  Result Value Ref Range Status   MRSA by PCR NEGATIVE NEGATIVE Final    Comment:        The GeneXpert MRSA Assay (FDA approved for NASAL specimens only), is one component of a comprehensive MRSA colonization surveillance program. It is not intended to diagnose MRSA infection nor to guide or monitor treatment for MRSA infections.   Urine culture     Status: None   Collection Time: 09/11/14  9:04 AM  Result Value Ref Range Status   Specimen Description URINE, RANDOM  Final   Special Requests NONE  Final   Culture  Setup Time   Final    09/11/2014 11:48 Performed at [facility]    Colony Count NO GROWTH Performed at [facility]   Final   Culture NO GROWTH Performed at [facility]   Final   Report Status 09/12/2014 FINAL  Final  Culture, Urine     Status: None   Collection Time: 09/12/14 10:46 AM  Result Value Ref Range Status   Specimen  Description URINE, CATHETERIZED  Final   Special Requests NONE  Final   Culture  Setup Time   Final    09/12/2014 19:00 Performed at [facility]    Colony Count NO GROWTH Performed at [facility]   Final   Culture NO GROWTH Performed at [facility]   Final   Report Status 09/13/2014 FINAL  Final     Scheduled Meds: . antiseptic oral rinse  7 mL Mouth Rinse q12n4p  . cefTRIAXone (ROCEPHIN)  IV  1 g Intravenous Q24H  . chlorhexidine  15 mL Mouth Rinse BID  . feeding supplement (ENSURE)  1 Container Oral TID BM  . folic acid  1 mg Oral Daily  . hydrALAZINE  10 mg Oral 3 times per day  . levETIRAcetam  500 mg Intravenous Q12H  . multivitamin with minerals  1 tablet Oral Daily  . nicotine  21 mg Transdermal Daily  . sodium chloride  3 mL Intravenous Q12H  . thiamine  100 mg Oral Daily   Or  . thiamine  100 mg Intravenous Daily   Continuous Infusions: . 0.9 % NaCl with KCl 40 mEq / L 100 mL/hr (09/16/14 1440)

## 2014-09-17 NOTE — Consult Note (Signed)
[facility] Liaison: Continue to follow for family interest [facility]. Will continue to follow, update CSW if availability changes. Thank you. Forrestine Himva Shaqueta Casady LCSW [phone]

## 2014-09-17 NOTE — Progress Notes (Signed)
[facility] Rounding Note Subjective:  Mark [name] says he was more awake when Dr. Lenise ArenaMeyers came in Won't wake up at all for me and she is unable to wake him up to feed him Remains on NS with KCL at 100/hour  Objective Vital signs in last 24 hours: Filed Vitals:   09/16/14 2000 09/16/14 2050 09/16/14 2209 09/17/14 0429  BP: 145/74 166/86 155/82 152/85  Pulse: 87 106 110 107  Temp:  100.6 F (38.1 C) 99.3 F (37.4 C) 98.4 F (36.9 C)  TempSrc:  Oral Oral Oral  Resp: 15 16  20   Height:  5\' 6"  (1.676 m)    Weight:  50.667 kg (111 lb 11.2 oz)    SpO2: 98% 98% 95% 96%   Weight change: 0.967 kg (2 lb 2.1 oz)  Intake/Output Summary (Last 24 hours) at 09/17/14 0913 Last data filed at 09/17/14 [id number]  Gross per 24 hour  Intake   1923 ml  Output   1300 ml  Net    623 ml   Physical Exam:  BP 152/85 mmHg  Pulse 107  Temp(Src) 98.4 F (36.9 C) (Oral)  Resp 20  Ht 5\' 6"  (1.676 m)  Wt 50.667 kg (111 lb 11.2 oz)  BMI 18.04 kg/m2  SpO2 96% Skin with reddened flaky patches esp on his face Will not wake up or open his eyes for me Currently with snoring resps No JVD Lungs grossly clear Condom cath - clear urine/still looks fairly concentrated  No edema of the LE's   Recent Labs Lab 09/15/14 1220 09/15/14 1952 09/16/14 0332 09/16/14 1408 09/16/14 1950 09/17/14 0145 09/17/14 0722  NA 115* 116* 117* 122* 117* 122* 121*  K 4.3 4.6 4.5 4.6 4.6 4.8 4.5  CL 80* 82* 84* 87* 86* 90* 88*  CO2 20 19 18* 20 19 21 19   GLUCOSE 115* 100* 107* 97 89 94 87  BUN 8 8 7 7 7 7 6   CREATININE 0.56 0.57 0.53 0.56 0.54 0.53 0.53  CALCIUM 8.9 9.1 8.7 9.0 8.7 8.7 8.6  PHOS  --   --   --  2.9 2.7 2.6 2.8    Recent Labs Lab 09/11/14 0525  09/16/14 1950 09/17/14 0145 09/17/14 0722  AST 27  --   --   --   --   ALT 11  --   --   --   --   ALKPHOS 55  --   --   --   --   BILITOT 0.5  --   --   --   --   PROT 6.5  --   --   --   --   ALBUMIN 3.3*  < > 3.0* 3.0* 3.0*  < > = values in  this interval not displayed.  Recent Labs Lab 09/10/14 1000 09/11/14 0525 09/12/14 0347 09/13/14 0410  WBC 11.6* 12.1* 11.3* 9.1  HGB 11.9* 13.5 13.1 13.1  HCT 33.4* 37.2* 36.9* 36.5*  MCV 85.6 85.3 84.2 83.7  PLT 317 396 431* 416*    Recent Labs Lab 09/16/14 1938 09/16/14 2044 09/16/14 2356 09/17/14 0425 09/17/14 0738  GLUCAP 99 90 111* 101* 86   Studies/Results:  Ct Head Wo Contrast  09/16/2014   CLINICAL DATA:  Intracranial hemorrhage.  EXAM: CT HEAD WITHOUT CONTRAST  TECHNIQUE: Contiguous axial images were obtained from the base of the skull through the vertex without intravenous contrast.  COMPARISON:  CT 09/15/2014  FINDINGS: Right temporal lobe hematoma appears unchanged and measures approximately  6.5 x 3.0 cm. Small right temporal and right parietal subdural hematoma unchanged. No new hemorrhage. No shift of the midline structures.  Mild intraventricular hemorrhage is unchanged in the occipital horn bilaterally.  Generalized atrophy of a moderate degree. Extensive chronic ischemic changes are stable.  IMPRESSION: No change from yesterday.  No new hemorrhage.   Electronically Signed   By: Marlan Palauharles  [name] M.D.   On: 09/16/2014 12:31   Ct Head Wo Contrast  09/15/2014   CLINICAL DATA:  Followup subdural hematoma.  EXAM: CT HEAD WITHOUT CONTRAST  TECHNIQUE: Contiguous axial images were obtained from the base of the skull through the vertex without intravenous contrast.  COMPARISON:  CT head 09/14/2014  FINDINGS: Image quality degraded by significant motion. The entire study was repeated due to motion.  Large high-density acute hematoma in the right temporoparietal lobe measures approximately 6 x 3.3 cm. This is similar in size however shows increased density compared with the prior study suggesting interval rebleeding. In addition there is now blood layering in the occipital horns which was not present previously.  Thin high density subdural hematoma over the right temporal parietal  lobe slightly smaller compared with the prior study.  Diffuse atrophy. Ventricular enlargement is stable and consistent with atrophy.  Advanced chronic microvascular ischemic change. Chronic infarct left thalamus and right cerebellum. Chronic occipital pole infarcts bilaterally. No acute ischemic infarct. Negative for mass lesion.  IMPRESSION: Large hematoma right temporoparietal lobe shows increased density compared to the prior study but is similar in size. This may indicate rebleeding in the interval. There is now blood in the right occipital horn which was not present previously.  Right hemispheric subdural hematoma slightly smaller now measuring approximately 2 mm in thickness. No shift of the midline structures.  These results will be called to the ordering clinician or representative by the Radiologist Assistant, and communication documented in the PACS or zVision Dashboard.   Electronically Signed   By: Marlan Palauharles  [name] M.D.   On: 09/15/2014 14:36   Medications: . 0.9 % NaCl with KCl 40 mEq / L 100 mL/hr (09/16/14 1440)   . antiseptic oral rinse  7 mL Mouth Rinse q12n4p  . cefTRIAXone (ROCEPHIN)  IV  1 g Intravenous Q24H  . chlorhexidine  15 mL Mouth Rinse BID  . feeding supplement (ENSURE)  1 Container Oral TID BM  . folic acid  1 mg Oral Daily  . hydrALAZINE  10 mg Oral 3 times per day  . levETIRAcetam  500 mg Intravenous Q12H  . multivitamin with minerals  1 tablet Oral Daily  . nicotine  21 mg Transdermal Daily  . sodium chloride  3 mL Intravenous Q12H  . thiamine  100 mg Oral Daily   Or  . thiamine  100 mg Intravenous Daily   Impression/Plan 1. Hyponatremia - Very difficult situation. Probably some chronicity (sodium low 130's in 2009), improved some with NS administration earlier in the admission (see table in HPI of original consult note)) though not to normal. 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 In addition  volume depleted on exam at time of initial consultation and still may be a little ont he dry side. UOSM of 479 fit with either vol depletion or SIADH or both. Sodium has trended up a little into the low 120's (last couple of checks 121-122) with NS. I think getting closer  to euvolemic.  See no need at this time to be more aggressive in terms of using 3% saline or addition of lasix for free water clearance.  Poor level of consciousness currently precludes use of salt tablets, or demeclocycline, and I don't think a vaptan is indicated, so would simply continue with the NS/KCl for now. 2. Acute SDH and large parenchymal bleed due to mechanical fall - not felt surgical candidate - neuro is following, feels pt unchanged, and has made no additional recs 3. 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 HTN - getting prn meds IV 5. Chronic alcoholism 6. Malnutrition 7. DNR status   Camille Bal, MD [facility] [phone] pager 09/17/2014, 9:13 AM  Addendum: Serum sodium at 2:08 PM up to 122 from 117 (3:30 AM) . Rate of correction is about 0.45 mEq/L/hour which is acceptable.  Continue current NS. Camille Bal, MD [facility] [phone] Pager 09/17/2014, 9:13 AM

## 2014-09-17 NOTE — Progress Notes (Signed)
Critical Lab Value, Sodium 121. Dr. Izola PriceMyers notified and advised to continue with plan of care. Justin Mendaudle, Kamdyn Colborn H, RN

## 2014-09-18 LAB — RENAL FUNCTION PANEL
Albumin: 3 g/dL — ABNORMAL LOW (ref 3.5–5.2)
Albumin: 2.8 g/dL — ABNORMAL LOW (ref 3.5–5.2)
Albumin: 2.8 g/dL — ABNORMAL LOW (ref 3.5–5.2)
Anion gap: 14 (ref 5–15)
Anion gap: 16 — ABNORMAL HIGH (ref 5–15)
Anion gap: 16 — ABNORMAL HIGH (ref 5–15)
BUN: 6 mg/dL (ref 6–23)
BUN: 7 mg/dL (ref 6–23)
BUN: 7 mg/dL (ref 6–23)
CHLORIDE: 83 meq/L — AB (ref 96–112)
CO2: 21 mEq/L (ref 19–32)
CO2: 19 mEq/L (ref 19–32)
CO2: 19 meq/L (ref 19–32)
CREATININE: 0.48 mg/dL — AB (ref 0.50–1.35)
CREATININE: 0.46 mg/dL — AB (ref 0.50–1.35)
Calcium: 8.7 mg/dL (ref 8.4–10.5)
Calcium: 8.5 mg/dL (ref 8.4–10.5)
Calcium: 8.5 mg/dL (ref 8.4–10.5)
Chloride: 87 mEq/L — ABNORMAL LOW (ref 96–112)
Chloride: 87 mEq/L — ABNORMAL LOW (ref 96–112)
Creatinine, Ser: 0.46 mg/dL — ABNORMAL LOW (ref 0.50–1.35)
GFR calc Af Amer: >90 mL/min (ref >90)
GFR calc Af Amer: >90 mL/min (ref >90)
GFR calc Af Amer: >90 mL/min (ref >90)
GFR calc non Af Amer: >90 mL/min (ref >90)
GFR calc non Af Amer: >90 mL/min (ref >90)
Glucose, Bld: 74 mg/dL (ref 70–99)
Glucose, Bld: 77 mg/dL (ref 70–99)
Glucose, Bld: 195 mg/dL — ABNORMAL HIGH (ref 70–99)
PHOSPHORUS: 3 mg/dL (ref 2.3–4.6)
Phosphorus: 2.8 mg/dL (ref 2.3–4.6)
Phosphorus: 2.9 mg/dL (ref 2.3–4.6)
Potassium: 4.4 mEq/L (ref 3.7–5.3)
Potassium: 4.6 mEq/L (ref 3.7–5.3)
Potassium: 4.3 mEq/L (ref 3.7–5.3)
Sodium: 122 mEq/L — ABNORMAL LOW (ref 137–147)
Sodium: 122 mEq/L — ABNORMAL LOW (ref 137–147)
Sodium: 118 mEq/L — CL (ref 137–147)

## 2014-09-18 LAB — CBC
HCT: 31 % — ABNORMAL LOW (ref 39.0–52.0)
HEMATOCRIT: 32 % — AB (ref 39.0–52.0)
Hemoglobin: 11.3 g/dL — ABNORMAL LOW (ref 13.0–17.0)
Hemoglobin: 11.2 g/dL — ABNORMAL LOW (ref 13.0–17.0)
MCH: 30.1 pg (ref 26.0–34.0)
MCH: 30.9 pg (ref 26.0–34.0)
MCHC: 35.3 g/dL (ref 30.0–36.0)
MCHC: 36.1 g/dL — ABNORMAL HIGH (ref 30.0–36.0)
MCV: 85.1 fL (ref 78.0–100.0)
MCV: 85.6 fL (ref 78.0–100.0)
PLATELETS: 458 10*3/uL — AB (ref 150–400)
Platelets: 455 10*3/uL — ABNORMAL HIGH (ref 150–400)
RBC: 3.76 MIL/uL — ABNORMAL LOW (ref 4.22–5.81)
RBC: 3.62 MIL/uL — AB (ref 4.22–5.81)
RDW: 12.3 % (ref 11.5–15.5)
RDW: 12.2 % (ref 11.5–15.5)
WBC: 14.5 10*3/uL — ABNORMAL HIGH (ref 4.0–10.5)
WBC: 16.3 10*3/uL — AB (ref 4.0–10.5)

## 2014-09-18 LAB — URINE CULTURE: Colony Count: 9000

## 2014-09-18 LAB — GLUCOSE, CAPILLARY
GLUCOSE-CAPILLARY: 181 mg/dL — AB (ref 70–99)
Glucose-Capillary: 68 mg/dL — ABNORMAL LOW (ref 70–99)
Glucose-Capillary: 69 mg/dL — ABNORMAL LOW (ref 70–99)
Glucose-Capillary: 73 mg/dL (ref 70–99)
Glucose-Capillary: 79 mg/dL (ref 70–99)
Glucose-Capillary: 79 mg/dL (ref 70–99)
Glucose-Capillary: 82 mg/dL (ref 70–99)
Glucose-Capillary: 82 mg/dL (ref 70–99)

## 2014-09-18 LAB — BASIC METABOLIC PANEL
Anion gap: 15 (ref 5–15)
BUN: 6 mg/dL (ref 6–23)
CO2: 19 mEq/L (ref 19–32)
Calcium: 8.4 mg/dL (ref 8.4–10.5)
Chloride: 86 mEq/L — ABNORMAL LOW (ref 96–112)
Creatinine, Ser: 0.45 mg/dL — ABNORMAL LOW (ref 0.50–1.35)
GFR calc Af Amer: >90 mL/min (ref >90)
Glucose, Bld: 82 mg/dL (ref 70–99)
POTASSIUM: 4.5 meq/L (ref 3.7–5.3)
Sodium: 120 mEq/L — CL (ref 137–147)

## 2014-09-18 MED ORDER — DEXTROSE 50 % IV SOLN
50.0000 mL | INTRAVENOUS | Status: DC | PRN
Start: 2014-09-18 — End: 2014-09-21
  Administered 2014-09-19: 50 mL via INTRAVENOUS
  Filled 2014-09-18: qty 50

## 2014-09-18 MED ORDER — DEXTROSE 50 % IV SOLN
1.0000 | Freq: Once | INTRAVENOUS | Status: AC
  Administered 2014-09-18: 50 mL via INTRAVENOUS
  Filled 2014-09-18: qty 50

## 2014-09-18 MED ORDER — LEVETIRACETAM IN NACL 1000 MG/100ML IV SOLN
1000.0000 mg | Freq: Two times a day (BID) | INTRAVENOUS | Status: DC
  Administered 2014-09-18 – 2014-09-21 (×7): 1000 mg via INTRAVENOUS
  Filled 2014-09-18 (×8): qty 100

## 2014-09-18 NOTE — Progress Notes (Signed)
Patient ZO:XWRUEAV:Lanier Julien GirtMcDaniel      DOB: 08-18-1951      WUJ:[id number]   [facility] at [facility] Progress Note    Subjective: Minimally responsive. Occassionally grunts when I stimulate him.      Filed Vitals:   09/18/14 1423  BP: 155/75  Pulse: 99  Temp: 99.7 F (37.6 C)  Resp: 20   Physical exam: Gen: minimally responsive CV: RRR LUNGS: scattered coarse sounds ABD: soft, ND EXT: no edema  CBC    Component Value Date/Time   WBC 16.3* 09/18/2014 1350   RBC 3.62* 09/18/2014 1350   HGB 11.2* 09/18/2014 1350   HCT 31.0* 09/18/2014 1350   PLT 458* 09/18/2014 1350   MCV 85.6 09/18/2014 1350   MCH 30.9 09/18/2014 1350   MCHC 36.1* 09/18/2014 1350   RDW 12.2 09/18/2014 1350   LYMPHSABS 1.9 09/09/2014 0340   MONOABS 1.3* 09/09/2014 0340   EOSABS 0.1 09/09/2014 0340   BASOSABS 0.0 09/09/2014 0340    CMP     Component Value Date/Time   NA 118* 09/18/2014 1350   K 4.3 09/18/2014 1350   CL 83* 09/18/2014 1350   CO2 19 09/18/2014 1350   GLUCOSE 195* 09/18/2014 1350   BUN 7 09/18/2014 1350   CREATININE 0.46* 09/18/2014 1350   CALCIUM 8.5 09/18/2014 1350   PROT 6.5 09/11/2014 0525   ALBUMIN 2.8* 09/18/2014 1350   AST 27 09/11/2014 0525   ALT 11 09/11/2014 0525   ALKPHOS 55 09/11/2014 0525   BILITOT 0.5 09/11/2014 0525   GFRNONAA >90 09/18/2014 1350   GFRAA >90 09/18/2014 1350     Assessment and plan: 63 yo male with PMHx of dementia, CVA's who presented with incarcerated hernia. Had fall post-op with resultant ICH. Felt to be poor surgical candidate and family has elected to pursue full comfort measures.   1. Code Status: DNR  2. Goals of Care: See previous documentation. Over past 2 days, family has been told by Neurology and NUS that this is not a terminal event and "survivable".  They would now like to pursue SNF rehab. While it may be true that from his ICH standpoint, it may not directly cause his death, I worry about complications from  this and his multiple comorbidities leading to short survival.  He continues to have significant hyponatremia, inability to swallow with high risk of aspiration in a PMHx of CVA's and dementia. I suspect "a good outcome" from his ICH still will be associated with very low QOL. I spoke Briefly with brother-in-law Romeo AppleBen today. It was difficult for him to discuss these issues with me, and he may very well be associating me with hospice. He asks that Dr Izola PriceMyers return a call to him today.     3. Symptom Management: Fortunately he is not particularly symptomatic at this point.   1. Anxiety/Agitation: Not needing PRN haldol. I think this is safe to continue  2. Pain: not needing PRN's  4. Psychosocial/Spiritual: [place].  Sister and Brother-in-law have been acting as surrogates.    I will plan on following up on Monday and seeing how much family is willing to discuss goals with me.

## 2014-09-18 NOTE — Progress Notes (Signed)
Per chart review, there is still concerns rearding patient quality of life. Pt family would like to pursue [facility]. However, pt must be actively participating in physical therapy and was unable to be aroused for treatment. Per chart review pt to speak further with attending and PMT. CSW to follow up on Monday.   Byrd HesselbachKristen Mellonie Guess, LCSW [phone]  ED CSW 09/18/2014 1613pm

## 2014-09-18 NOTE — Progress Notes (Signed)
During progression, MD shared that patient was no longer a candidtate for [facility] and pt disposition plan is SNF. CSW to follow up with family.   Byrd HesselbachKristen Mellody Masri, LCSW [phone]  ED CSW 09/18/2014 1156am

## 2014-09-18 NOTE — Progress Notes (Addendum)
Patient blood sugar continues to drop into the 68, doctor notified and orders given for the Hypoglycemia Protocol, patient given 1 amp of D50 earlier during the day, after checking FSBS this evening patient's FSBS = 69, Dr notified, orders given for another amp of D50, during all these events patient was in stable condition without signs of hypoglycemia, patient in stable condition at this time

## 2014-09-18 NOTE — Consult Note (Signed)
[facility] Liaison: [facility] room offered this morning. Per CSW patient/family no longer interested in [facility]. Please re-consult if plan changes. Thank you. Forrestine Himva Cap Massi LCSW [phone]

## 2014-09-18 NOTE — Evaluation (Signed)
SLP Cancellation Note  Patient Details Name: Mark BilberryMichael Cavendish MRN: [id number] DOB: October 21, 1951   Cancelled treatment:       Reason Eval/Treat Not Completed: Fatigue/lethargy limiting ability to participate  Pt did not awaken adequately for po even with deep sternal rub.  Snoring observed with loss of secretions from left anterior labia - SLP orally suctioned.  RN informed to call SLP if pt awakens adequately for po within one hour. Otherwise will attempt evaluation next week if pt is alert.    Mickie BailKimball, Rafia Shedden Ann Zoria Rawlinson BisonKimball, [place] [facility] SLP [phone]

## 2014-09-18 NOTE — Progress Notes (Signed)
Patient ID: Mark [name], male   DOB: [birth year], 63 y.o.   MRN: [id number]  [facility] PROGRESS NOTE  Cornellius Kropp WUJ:[id number] DOB: 01-17-1951 DOA: 09/08/2014 PCP: Kaleen Mask, MD   Brief narrative: 63 y.o. male history of hypertension and alcoholism, previous stroke was brought to the ER after patient was found to have frequent falls. Patient lives with his friend in an apartment and is frequently checked by his sister. Patient's sister stated that patient was recently diagnosed with dementia and is largely dependent on others for his daily living activities. Over the last 1 week patient's friend noted that patient has been having frequent falls but did not lose consciousness. So patient was brought to the ER. In the ER CT of the head did not show anything acute and on exam patient had large right inguinal hernia which was not reducible and CT abdomen and pelvis showed features concerning for obstruction, on-call surgeon was consulted. patient noted to have hyponatremia on presentation with Na of 117.  Pt s/p inguinal hernia repair by Dr. Johna Sheriff 09/09/2014. Hospital course complicated by slow recovery, persistent hyponatremia, fall with acute rt subdural and left large intraparenchymal bleed with encephalopathy.   Assessment and Plan:   Principal Problem:  Fall with rt subdural and left large parenchymal bleed on 11/23 - Secondary to mechanical fall, hit his head on the right, CT head this AM with no new changes - d/w Dr. Conchita Paris and he explained that CT finding notable for intraparenchymal bleed of this type, especially since not causing mass effect, does not require surgical intervention (regardless of underlying medical conditions) and may spontaneously resolve over period of time, it is also not life threatening but can unfortunately become worse at which point surgical intervention would still not be indicated  - right subdural hematoma is actually  smaller and Dr. Conchita Paris also explained it does not require an intervention  - pt is minimally responsive this AM, moaning but not coherent and has not been taking anything PO - d/w his brother in law,  ? Proceed with comfort care as no response to current medical treatment noted - brother in law with discuss with family and will let me know what the decision is - I explained that pt can not be d/c to SNF in this condition and asked them to consider residential hospice   Hyponatremia - possibly chronic component , Na was 117 on presentation. 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 D/w Dr Eliott Nine . 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 - Cortisol and TSH normal. Na trending over the past 48 hours: 116 --> 117 --> 122  - appreciate Dr. Elza Rafter assistance   Incarcerated inguinal hernia - status post repair with mesh, post op day #9 - continue to clean site with soap and water, no need for dressing, keep surgical area dry and clean - pt still not eating, grim prognosis   Fever - still present low grade T 99.33F, better  Active Problems: Encephalopathy - Multifactorial, post op, UTI and now with cerebral bleed. - d/w family residential hospice, no further recommendations in terms of medical management from neurosurgery team - family to decide on d/c plan   Hypokalemia - supplemented   Hypertension - stable. Prn hydralazine  Leukocytosis - resolved. Secondary to incarcerated hernia and UTI.  Hx of heavy Alcoholism -No signs of withdrawal  Severe PCM - feed as pt able to tolerate   Acute functional quadriplegia -  Seen by PT/OT. Remains inpatient for now   DVT prophylaxis  SCD's  Code Status: DNR Family Communication: Spoke with pt's brother over the phone  Disposition Plan: Remains inpatient   IV Access:    Peripheral IV Procedures and diagnostic studies:    Ct Head Wo  Contrast 09/15/2014 Large hematoma right temporoparietal lobe shows increased density compared to the prior study but is similar in size. This may indicate rebleeding in the interval. There is now blood in the right occipital horn which was not present previously. Right hemispheric subdural hematoma slightly smaller now measuring approximately 2 mm in thickness. No shift of the midline structures.   Ct Head Wo Contrast 09/14/2014 Examination is positive for acute right subdural hematoma and large right temporal lobe parenchymal hematoma. Medical Consultants:    Neurology  Nephrology   Neurosurgery   PCT Anti-Infectives:    Completed Rocephin   Debbora PrestoMAGICK-Cammeron Greis, MD  [facility] Pager [phone]  If 7PM-7AM, please contact night-coverage [email or web address] Password [email or web address] 09/18/2014, 11:54 AM   LOS: 10 days   HPI/Subjective: No events overnight.   Objective: Filed Vitals:   09/17/14 1424 09/17/14 2126 09/18/14 0505 09/18/14 0606  BP: 148/97 176/83 158/99   Pulse: 100 96 98   Temp: 97.8 F (36.6 C) 99.4 F (37.4 C) 98 F (36.7 C)   TempSrc: Oral Oral Axillary   Resp: 18 16 20    Height:      Weight:    49.079 kg (108 lb 3.2 oz)  SpO2: 97% 99% 100%     Intake/Output Summary (Last 24 hours) at 09/18/14 1154 Last data filed at 09/18/14 0713  Gross per 24 hour  Intake      0 ml  Output   1600 ml  Net  -1600 ml    Exam:   General:  Pt is minimally responsive, opens eyes with sternal rub but non coherent   Cardiovascular: Regular rate and rhythm, S1/S2, no murmurs, no rubs, no gallops  Respiratory: Clear to auscultation bilaterally, diminished breath sounds at bases   Abdomen: Soft, non tender, non distended, bowel sounds present, no guarding   Data Reviewed: Basic Metabolic Panel:  Recent Labs Lab 09/17/14 0722 09/17/14 1309 09/17/14 1930 09/18/14 0148 09/18/14 0756  NA 121* 122* 121* 122* 122*  K 4.5 4.5 4.2 4.4 4.6  CL 88* 88* 86* 87* 87*  CO2  19 20 21 21 19   GLUCOSE 87 88 77 74 77  BUN 6 6 6 6 7   CREATININE 0.53 0.49* 0.51 0.48* 0.46*  CALCIUM 8.6 8.5 8.6 8.7 8.5  PHOS 2.8 3.0 2.7 2.8 3.0   Liver Function Tests:  Recent Labs Lab 09/17/14 0722 09/17/14 1309 09/17/14 1930 09/18/14 0148 09/18/14 0756  ALBUMIN 3.0* 2.9* 3.0* 3.0* 2.8*   CBC:  Recent Labs Lab 09/12/14 0347 09/13/14 0410 09/17/14 1309 09/18/14 0148  WBC 11.3* 9.1 19.4* 14.5*  HGB 13.1 13.1 11.1* 11.3*  HCT 36.9* 36.5* 31.5* 32.0*  MCV 84.2 83.7 86.5 85.1  PLT 431* 416* 413* 455*   CBG:  Recent Labs Lab 09/17/14 1657 09/17/14 1932 09/17/14 2352 09/18/14 0350 09/18/14 0742  GLUCAP 78 79 80 82 79    Recent Results (from the past 240 hour(s))  MRSA PCR Screening     Status: None   Collection Time: 09/09/14  3:08 AM  Result Value Ref Range Status   MRSA by PCR NEGATIVE NEGATIVE Final    Comment:        The GeneXpert MRSA Assay (FDA  approved for NASAL specimens only), is one component of a comprehensive MRSA colonization surveillance program. It is not intended to diagnose MRSA infection nor to guide or monitor treatment for MRSA infections.   Urine culture     Status: None   Collection Time: 09/11/14  9:04 AM  Result Value Ref Range Status   Specimen Description URINE, RANDOM  Final   Special Requests NONE  Final   Culture  Setup Time   Final    09/11/2014 11:48 Performed at [facility]    Colony Count NO GROWTH Performed at [facility]   Final   Culture NO GROWTH Performed at [facility]   Final   Report Status 09/12/2014 FINAL  Final  Culture, Urine     Status: None   Collection Time: 09/12/14 10:46 AM  Result Value Ref Range Status   Specimen Description URINE, CATHETERIZED  Final   Special Requests NONE  Final   Culture  Setup Time   Final    09/12/2014 19:00 Performed at [facility]    Colony Count NO GROWTH Performed at [facility]   Final   Culture NO  GROWTH Performed at [facility]   Final   Report Status 09/13/2014 FINAL  Final  Urine culture     Status: None   Collection Time: 09/17/14 11:55 AM  Result Value Ref Range Status   Specimen Description URINE, RANDOM  Final   Special Requests NONE  Final   Culture  Setup Time   Final    09/17/2014 14:38 Performed at [facility]    Colony Count   Final    9,000 COLONIES/ML Performed at [facility]    Culture   Final    INSIGNIFICANT GROWTH Performed at [facility]    Report Status 09/18/2014 FINAL  Final     Scheduled Meds: . antiseptic oral rinse  7 mL Mouth Rinse q12n4p  . cefTRIAXone (ROCEPHIN)  IV  1 g Intravenous Q24H  . chlorhexidine  15 mL Mouth Rinse BID  . feeding supplement (ENSURE)  1 Container Oral TID BM  . folic acid  1 mg Oral Daily  . hydrALAZINE  10 mg Oral 3 times per day  . levETIRAcetam  1,000 mg Intravenous Q12H  . multivitamin with minerals  1 tablet Oral Daily  . nicotine  21 mg Transdermal Daily  . sodium chloride  3 mL Intravenous Q12H  . thiamine  100 mg Oral Daily   Or  . thiamine  100 mg Intravenous Daily   Continuous Infusions: . 0.9 % NaCl with KCl 40 mEq / L 100 mL/hr (09/17/14 2132)         n

## 2014-09-18 NOTE — Progress Notes (Signed)
OT Cancellation Note  Patient Details Name: Mark BilberryMichael Migues MRN: [id number] DOB: 01-05-1951   Cancelled Treatment:    Reason Eval/Treat Not Completed: Other (comment).  Pt not alert enough for OT today.  Will check back next week.  Leelyn Jasinski 09/18/2014, 2:21 PM  Marica OtterMaryellen Torian Quintero, OTR/L [phone] 09/18/2014

## 2014-09-18 NOTE — Progress Notes (Signed)
CSW met with pt, pt sister, and patient brother in law at bedside. Mr. Buena Irish stated that they wish for patient to go to [facility] facility when medically stable as patient is not appropriate for [facility] at this time. CSW to send clinicals to [facility] for review. Pt family interested in [facility] is not able to offer a bed.   Noreene Larsson [phone]  ED CSW 09/18/2014 1300pm

## 2014-09-18 NOTE — Progress Notes (Signed)
[facility] Rounding Note Subjective:  Pt unresponsive Could not participate or even be awakened for swallow eval  Objective Vital signs in last 24 hours: Filed Vitals:   09/17/14 1424 09/17/14 2126 09/18/14 0505 09/18/14 0606  BP: 148/97 176/83 158/99   Pulse: 100 96 98   Temp: 97.8 F (36.6 C) 99.4 F (37.4 C) 98 F (36.7 C)   TempSrc: Oral Oral Axillary   Resp: 18 16 20    Height:      Weight:    49.079 kg (108 lb 3.2 oz)  SpO2: 97% 99% 100%    Weight change: -1.588 kg (-3 lb 8 oz)  Intake/Output Summary (Last 24 hours) at 09/18/14 1253 Last data filed at 09/18/14 0713  Gross per 24 hour  Intake      0 ml  Output   1600 ml  Net  -1600 ml   Physical Exam:  BP 158/99 mmHg  Pulse 98  Temp(Src) 98 F (36.7 C) (Axillary)  Resp 20  Ht 5\' 6"  (1.676 m)  Wt 49.079 kg (108 lb 3.2 oz)  BMI 17.47 kg/m2  SpO2 100% Skin with reddened flaky patches esp on his face Will not wake up or open his eyes for me - does not open eyes to sternal rub. Constant snoring. No JVD Lungs grossly clear Condom cath - clear urine/still looks fairly concentrated  No edema    Recent Labs Lab 09/16/14 1950 09/17/14 0145 09/17/14 0722 09/17/14 1309 09/17/14 1930 09/18/14 0148 09/18/14 0756  NA 117* 122* 121* 122* 121* 122* 122*  K 4.6 4.8 4.5 4.5 4.2 4.4 4.6  CL 86* 90* 88* 88* 86* 87* 87*  CO2 19 21 19 20 21 21 19   GLUCOSE 89 94 87 88 77 74 77  BUN 7 7 6 6 6 6 7   CREATININE 0.54 0.53 0.53 0.49* 0.51 0.48* 0.46*  CALCIUM 8.7 8.7 8.6 8.5 8.6 8.7 8.5  PHOS 2.7 2.6 2.8 3.0 2.7 2.8 3.0    Recent Labs Lab 09/17/14 1930 09/18/14 0148 09/18/14 0756  ALBUMIN 3.0* 3.0* 2.8*    Recent Labs Lab 09/12/14 0347 09/13/14 0410 09/17/14 1309 09/18/14 0148  WBC 11.3* 9.1 19.4* 14.5*  HGB 13.1 13.1 11.1* 11.3*  HCT 36.9* 36.5* 31.5* 32.0*  MCV 84.2 83.7 86.5 85.1  PLT 431* 416* 413* 455*    Recent Labs Lab 09/17/14 1932 09/17/14 2352 09/18/14 0350 09/18/14 0742  09/18/14 1205  GLUCAP 79 80 82 79 68*   Studies/Results:  No results found. Medications: . 0.9 % NaCl with KCl 40 mEq / L 100 mL/hr (09/17/14 2132)   . antiseptic oral rinse  7 mL Mouth Rinse q12n4p  . cefTRIAXone (ROCEPHIN)  IV  1 g Intravenous Q24H  . chlorhexidine  15 mL Mouth Rinse BID  . dextrose  1 ampule Intravenous Once  . feeding supplement (ENSURE)  1 Container Oral TID BM  . folic acid  1 mg Oral Daily  . hydrALAZINE  10 mg Oral 3 times per day  . levETIRAcetam  1,000 mg Intravenous Q12H  . multivitamin with minerals  1 tablet Oral Daily  . nicotine  21 mg Transdermal Daily  . sodium chloride  3 mL Intravenous Q12H  . thiamine  100 mg Oral Daily   Or  . thiamine  100 mg Intravenous Daily   Impression/Plan 1. Hyponatremia - Difficult situation. Probably some chronicity (sodium low 130's in 2009), improved some with NS administration earlier in the admission (see table in HPI  of original consult note)) though not to normal. 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 In addition  volume depleted on exam at time of initial consultation and still may be a little ont he dry side. UOSM of 479 fit with either vol depletion or SIADH or both. Sodium now levelling off in the low 120's. Close to euvolemic. No po intake at all.  Unclear to me what the plan is in terms of total comfort care vs being more aggressive.  Poor level of consciousness currently precludes use of salt tablets, or demeclocycline, and I don't think a vaptan is indicated, so would simply continue with the NS/KCl for now. 2. Acute SDH and large parenchymal bleed due to mechanical fall - not felt surgical candidate - neuro was following, made no additional recs 3. 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 HTN - getting prn meds IV 5. Chronic  alcoholism 6. Malnutrition 7. DNR status   Camille Balynthia Hays Dunnigan, MD [facility] [phone] pager 09/18/2014, 12:53 PM

## 2014-09-18 NOTE — Progress Notes (Signed)
PT Cancellation Note  Patient Details Name: Mark [name] MRN: [id number] DOB: 12-13-1950   Cancelled Treatment:     per RN pt would be unable to tolerate.   Felecia ShellingLori Jerald Villalona  PTA [facility]  Acute  Rehab Pager      [phone]

## 2014-09-19 ENCOUNTER — Inpatient Hospital Stay (HOSPITAL_COMMUNITY): Payer: Medicare Other

## 2014-09-19 LAB — CBC
HCT: 30.1 % — ABNORMAL LOW (ref 39.0–52.0)
Hemoglobin: 10.7 g/dL — ABNORMAL LOW (ref 13.0–17.0)
MCH: 30.2 pg (ref 26.0–34.0)
MCHC: 35.5 g/dL (ref 30.0–36.0)
MCV: 85 fL (ref 78.0–100.0)
PLATELETS: 483 10*3/uL — AB (ref 150–400)
RBC: 3.54 MIL/uL — ABNORMAL LOW (ref 4.22–5.81)
RDW: 12.2 % (ref 11.5–15.5)
WBC: 18.3 10*3/uL — ABNORMAL HIGH (ref 4.0–10.5)

## 2014-09-19 LAB — GLUCOSE, CAPILLARY
GLUCOSE-CAPILLARY: 78 mg/dL (ref 70–99)
GLUCOSE-CAPILLARY: 105 mg/dL — AB (ref 70–99)
Glucose-Capillary: 79 mg/dL (ref 70–99)
Glucose-Capillary: 64 mg/dL — ABNORMAL LOW (ref 70–99)
Glucose-Capillary: 214 mg/dL — ABNORMAL HIGH (ref 70–99)
Glucose-Capillary: 95 mg/dL (ref 70–99)

## 2014-09-19 LAB — RENAL FUNCTION PANEL
ALBUMIN: 2.8 g/dL — AB (ref 3.5–5.2)
ANION GAP: 13 (ref 5–15)
ANION GAP: 15 (ref 5–15)
ANION GAP: 16 — AB (ref 5–15)
Albumin: 1.7 g/dL — ABNORMAL LOW (ref 3.5–5.2)
Albumin: 2.9 g/dL — ABNORMAL LOW (ref 3.5–5.2)
Albumin: 2.8 g/dL — ABNORMAL LOW (ref 3.5–5.2)
Albumin: 2.8 g/dL — ABNORMAL LOW (ref 3.5–5.2)
Anion gap: 15 (ref 5–15)
Anion gap: 16 — ABNORMAL HIGH (ref 5–15)
BUN: 4 mg/dL — ABNORMAL LOW (ref 6–23)
BUN: 7 mg/dL (ref 6–23)
BUN: 9 mg/dL (ref 6–23)
BUN: 8 mg/dL (ref 6–23)
BUN: 9 mg/dL (ref 6–23)
CALCIUM: 8.8 mg/dL (ref 8.4–10.5)
CHLORIDE: 85 meq/L — AB (ref 96–112)
CHLORIDE: 85 meq/L — AB (ref 96–112)
CO2: 14 mEq/L — ABNORMAL LOW (ref 19–32)
CO2: 21 meq/L (ref 19–32)
CO2: 21 meq/L (ref 19–32)
CO2: 21 mEq/L (ref 19–32)
CO2: 20 meq/L (ref 19–32)
Calcium: 5.4 mg/dL — CL (ref 8.4–10.5)
Calcium: 8.6 mg/dL (ref 8.4–10.5)
Calcium: 8.4 mg/dL (ref 8.4–10.5)
Calcium: 8.6 mg/dL (ref 8.4–10.5)
Chloride: 97 mEq/L (ref 96–112)
Chloride: 87 mEq/L — ABNORMAL LOW (ref 96–112)
Chloride: 87 mEq/L — ABNORMAL LOW (ref 96–112)
Creatinine, Ser: 0.26 mg/dL — ABNORMAL LOW (ref 0.50–1.35)
Creatinine, Ser: 0.48 mg/dL — ABNORMAL LOW (ref 0.50–1.35)
Creatinine, Ser: 0.52 mg/dL (ref 0.50–1.35)
Creatinine, Ser: 0.46 mg/dL — ABNORMAL LOW (ref 0.50–1.35)
Creatinine, Ser: 0.47 mg/dL — ABNORMAL LOW (ref 0.50–1.35)
GFR calc Af Amer: >90 mL/min (ref >90)
GFR calc Af Amer: >90 mL/min (ref >90)
GFR calc non Af Amer: >90 mL/min (ref >90)
GLUCOSE: 52 mg/dL — AB (ref 70–99)
Glucose, Bld: 82 mg/dL (ref 70–99)
Glucose, Bld: 79 mg/dL (ref 70–99)
Glucose, Bld: 184 mg/dL — ABNORMAL HIGH (ref 70–99)
Glucose, Bld: 106 mg/dL — ABNORMAL HIGH (ref 70–99)
PHOSPHORUS: 1.9 mg/dL — AB (ref 2.3–4.6)
POTASSIUM: 4.9 meq/L (ref 3.7–5.3)
POTASSIUM: 3.8 meq/L (ref 3.7–5.3)
POTASSIUM: 4 meq/L (ref 3.7–5.3)
Phosphorus: 3.2 mg/dL (ref 2.3–4.6)
Phosphorus: 3.6 mg/dL (ref 2.3–4.6)
Phosphorus: 2.9 mg/dL (ref 2.3–4.6)
Phosphorus: 3 mg/dL (ref 2.3–4.6)
Potassium: 4.2 mEq/L (ref 3.7–5.3)
Potassium: 4.6 mEq/L (ref 3.7–5.3)
SODIUM: 124 meq/L — AB (ref 137–147)
SODIUM: 121 meq/L — AB (ref 137–147)
SODIUM: 123 meq/L — AB (ref 137–147)
Sodium: 124 mEq/L — ABNORMAL LOW (ref 137–147)
Sodium: 121 mEq/L — CL (ref 137–147)

## 2014-09-19 MED ORDER — DEXTROSE 5 % IV SOLN
INTRAVENOUS | Status: DC
  Administered 2014-09-19 – 2014-09-20 (×2): via INTRAVENOUS
  Filled 2014-09-19 (×4): qty 1000

## 2014-09-19 NOTE — Progress Notes (Signed)
Patient ID: Mark [name], male   DOB: 1950-12-04, 63 y.o.   MRN: [id number]  [facility] PROGRESS NOTE  Mark BilberryMichael Louison [name]:[id number] DOB: 1950-12-04 DOA: 09/08/2014 PCP: Mark MaskELKINS,[name], MD   Brief narrative: 63 y.o. male history of hypertension and alcoholism, previous stroke was brought to the ER after patient was found to have frequent falls. Patient lives with his friend in an apartment and is frequently checked by his sister. Patient's sister stated that patient was recently diagnosed with dementia and is largely dependent on others for his daily living activities. Over the last 1 week patient's friend noted that patient has been having frequent falls but did not lose consciousness. So patient was brought to the ER. In the ER CT of the head did not show anything acute and on exam patient had large right inguinal hernia which was not reducible and CT abdomen and pelvis showed features concerning for obstruction, on-call surgeon was consulted. patient noted to have hyponatremia on presentation with Na of 117.  Pt s/p inguinal hernia repair by Dr. Johna [name] 09/09/2014. Hospital course complicated by slow recovery, persistent hyponatremia, fall with acute rt subdural and left large intraparenchymal bleed with encephalopathy.   Assessment and Plan:   Principal Problem:  Fall with rt subdural and left large parenchymal bleed on 11/23 - d/w Dr. [name] and he explained that CT finding notable for intraparenchymal bleed of this type, especially since not causing mass effect, does not require surgical intervention (regardless of underlying medical conditions) and may spontaneously resolve over period of time, it is also not life threatening but can unfortunately become worse at which point surgical intervention would still not be indicated  - right subdural hematoma is actually smaller and Dr. [name] also explained it does not require an intervention  - pt is minimally  responsive this AM, moaning but not coherent and has not been taking anything PO - d/w his brother in law, ? Proceed with comfort care as no response to current medical treatment noted - brother in law to discuss with family and will let me know what the decision is - I explained that pt can not be d/c to SNF in this condition and asked them to consider residential hospice   Hyponatremia - sodium up this AM 124 but pt remains unresponsive difficult to arouse  - appreciate Dr. [name]'s assistance   Incarcerated inguinal hernia - status post repair with mesh, post op day #10 - continue to clean site with soap and water, no need for dressing, keep surgical area dry and clean - pt still not eating, grim prognosis   Fever - still present low grade T 99.52F, better  - worsening WBC, worrisome for underlying developing SIRS, ? Aspiration  - will request CXR, no sure that broadening ABX would afect clinical course at this time - d/w family and consideration of [facility]  Active Problems: Encephalopathy - Multifactorial, dementia, cerebral bleed, FTT - d/w family residential hospice, no further recommendations in terms of medical management from neurosurgery team - family to decide on d/c plan  - [facility] available if family in agreement   Hypokalemia - supplemented   Hypertension - stable. Prn hydralazine  Leukocytosis - worse this AM with tachycardia and low grade fever, worrisome for developing infectious etiology as noted above  - CXR requested   Hx of heavy Alcoholism -No signs of withdrawal  Severe PCM - no PO intake, do not think that tube feeds appropriate as per pt's known wishes to avoid  life prolonging interventions   Acute functional quadriplegia - plan d/c ? Residential hospice   DVT prophylaxis  SCD's  Code Status: DNR Family Communication: Spoke with pt's brother over the phone  Disposition Plan: Remains inpatient   IV Access:     Peripheral IV Procedures and diagnostic studies:    Ct Head Wo Contrast 09/15/2014 Large hematoma right temporoparietal lobe shows increased density compared to the prior study but is similar in size. This may indicate rebleeding in the interval. There is now blood in the right occipital horn which was not present previously. Right hemispheric subdural hematoma slightly smaller now measuring approximately 2 mm in thickness. No shift of the midline structures.   Ct Head Wo Contrast 09/14/2014 Examination is positive for acute right subdural hematoma and large right temporal lobe parenchymal hematoma. Medical Consultants:    Neurology  Nephrology   Neurosurgery   PCT Anti-Infectives:    Completed Rocephin  Debbora PrestoMAGICK-Tosha Belgarde, MD  [facility] Pager [phone]  If 7PM-7AM, please contact night-coverage [email or web address] Password [email or web address] 09/19/2014, 1:11 PM   LOS: 11 days   HPI/Subjective: No events overnight.   Objective: Filed Vitals:   09/18/14 0606 09/18/14 1423 09/18/14 2005 09/19/14 0608  BP:  155/75 168/70 154/77  Pulse:  99 104 109  Temp:  99.7 F (37.6 C) 98.4 F (36.9 C) 99 F (37.2 C)  TempSrc:  Oral Oral Axillary  Resp:  20 19 17   Height:      Weight: 49.079 kg (108 lb 3.2 oz)   51.529 kg (113 lb 9.6 oz)  SpO2:  100% 95% 98%    Intake/Output Summary (Last 24 hours) at 09/19/14 1311 Last data filed at 09/19/14 0900  Gross per 24 hour  Intake   4415 ml  Output   1050 ml  Net   3365 ml    Exam:   General:  Pt is unresponsive,unable to arouse  Cardiovascular: Regular rhythm, tachycardic, no rubs, no gallops  Respiratory: Scattered rhonchi with diminished air movement at bases   Abdomen: Soft, non tender, non distended, bowel sounds present, no guarding  Extremities: No edema, pulses DP and PT palpable bilaterally  Data Reviewed: Basic Metabolic Panel:  Recent Labs Lab 09/18/14 0756 09/18/14 1350 09/18/14 1906 09/18/14 2200  09/19/14 0140 09/19/14 0710  NA 122* 118* 124* 120* 121* 124*  K 4.6 4.3 4.9 4.5 4.2 4.6  CL 87* 83* 97 86* 85* 87*  CO2 19 19 14* 19 21 21   GLUCOSE 77 195* 52* 82 82 79  BUN 7 7 4* 6 7 9   CREATININE 0.46* 0.46* 0.26* 0.45* 0.48* 0.52  CALCIUM 8.5 8.5 5.4* 8.4 8.6 8.8  PHOS 3.0 2.9 1.9*  --  3.2 3.6   Liver Function Tests:  Recent Labs Lab 09/18/14 0756 09/18/14 1350 09/18/14 1906 09/19/14 0140 09/19/14 0710  ALBUMIN 2.8* 2.8* 1.7* 2.9* 2.8*   CBC:  Recent Labs Lab 09/13/14 0410 09/17/14 1309 09/18/14 0148 09/18/14 1350 09/19/14 0140  WBC 9.1 19.4* 14.5* 16.3* 18.3*  HGB 13.1 11.1* 11.3* 11.2* 10.7*  HCT 36.5* 31.5* 32.0* 31.0* 30.1*  MCV 83.7 86.5 85.1 85.6 85.0  PLT 416* 413* 455* 458* 483*   CBG:  Recent Labs Lab 09/18/14 2356 09/19/14 0401 09/19/14 0729 09/19/14 1217 09/19/14 1302  GLUCAP 82 78 79 64* 214*    Recent Results (from the past 240 hour(s))  Urine culture     Status: None   Collection Time: 09/11/14  9:04 AM  Result Value Ref Range Status  Specimen Description URINE, RANDOM  Final   Special Requests NONE  Final   Culture  Setup Time   Final    09/11/2014 11:48 Performed at [facility]    Colony Count NO GROWTH Performed at [facility]   Final   Culture NO GROWTH Performed at [facility]   Final   Report Status 09/12/2014 FINAL  Final  Culture, Urine     Status: None   Collection Time: 09/12/14 10:46 AM  Result Value Ref Range Status   Specimen Description URINE, CATHETERIZED  Final   Special Requests NONE  Final   Culture  Setup Time   Final    09/12/2014 19:00 Performed at [facility]    Colony Count NO GROWTH Performed at [facility]   Final   Culture NO GROWTH Performed at [facility]   Final   Report Status 09/13/2014 FINAL  Final  Urine culture     Status: None   Collection Time: 09/17/14 11:55 AM  Result Value Ref Range Status   Specimen Description URINE,  RANDOM  Final   Special Requests NONE  Final   Culture  Setup Time   Final    09/17/2014 14:38 Performed at [facility] Count   Final    9,000 COLONIES/ML Performed at [facility]    Culture   Final    INSIGNIFICANT GROWTH Performed at [facility]    Report Status 09/18/2014 FINAL  Final     Scheduled Meds: . folic acid  1 mg Oral Daily  . hydrALAZINE  10 mg Oral 3 times per day  . levETIRAcetam  1,000 mg Intravenous Q12H  . nicotine  21 mg Transdermal Daily  . thiamine  100 mg Oral Daily   Continuous Infusions: . 0.9 % NaCl with KCl 40 mEq / L 20 mL/hr (09/18/14 2215)  . dextrose 5 % with kcl        '

## 2014-09-19 NOTE — Progress Notes (Addendum)
Hypoglycemic Event  CBG: 64  Treatment: D50 IV 50 mL  Symptoms: None  Follow-up CBG:  Time: 1304 CBG Result: 214  Possible Reasons for Event: Inadequate meal intake  Comments/MD notified: Dr. Izola PriceMyers notified    [name], [name]  Remember to initiate Hypoglycemia Order Set & complete

## 2014-09-19 NOTE — Progress Notes (Signed)
PT Cancellation Note  Patient Details Name: Mark BilberryMichael [name] MRN: [id number] DOB: 1951-08-21   Cancelled Treatment:    Reason Eval/Treat Not Completed: Medical issues which prohibited therapy (lethargic, has safety sitter.)   Rada HayHill, [name] 09/19/2014, 12:49 PM Blanchard KelchKaren Yvonda Fouty PT [phone]

## 2014-09-20 LAB — CBC
HCT: 30.5 % — ABNORMAL LOW (ref 39.0–52.0)
Hemoglobin: 10.9 g/dL — ABNORMAL LOW (ref 13.0–17.0)
MCH: 30.4 pg (ref 26.0–34.0)
MCHC: 35.7 g/dL (ref 30.0–36.0)
MCV: 85.2 fL (ref 78.0–100.0)
PLATELETS: 490 10*3/uL — AB (ref 150–400)
RBC: 3.58 MIL/uL — ABNORMAL LOW (ref 4.22–5.81)
RDW: 12.2 % (ref 11.5–15.5)
WBC: 13.4 10*3/uL — AB (ref 4.0–10.5)

## 2014-09-20 LAB — GLUCOSE, CAPILLARY
GLUCOSE-CAPILLARY: 101 mg/dL — AB (ref 70–99)
GLUCOSE-CAPILLARY: 97 mg/dL (ref 70–99)
Glucose-Capillary: 104 mg/dL — ABNORMAL HIGH (ref 70–99)
Glucose-Capillary: 97 mg/dL (ref 70–99)
Glucose-Capillary: 104 mg/dL — ABNORMAL HIGH (ref 70–99)
Glucose-Capillary: 114 mg/dL — ABNORMAL HIGH (ref 70–99)

## 2014-09-20 LAB — BASIC METABOLIC PANEL
ANION GAP: 13 (ref 5–15)
BUN: 9 mg/dL (ref 6–23)
CALCIUM: 8.5 mg/dL (ref 8.4–10.5)
CO2: 23 mEq/L (ref 19–32)
Chloride: 88 mEq/L — ABNORMAL LOW (ref 96–112)
Creatinine, Ser: 0.49 mg/dL — ABNORMAL LOW (ref 0.50–1.35)
GFR calc Af Amer: >90 mL/min (ref >90)
Glucose, Bld: 104 mg/dL — ABNORMAL HIGH (ref 70–99)
Potassium: 4.2 mEq/L (ref 3.7–5.3)
SODIUM: 124 meq/L — AB (ref 137–147)

## 2014-09-20 NOTE — Progress Notes (Signed)
Patient ID: Mark [name], male   DOB: March 25, 1951, 63 y.o.   MRN: [id number]  [facility] PROGRESS NOTE  Mark [name] NFA:[id number] DOB: 09/26/1951 DOA: 09/08/2014 PCP: Kaleen Mask, MD  Brief narrative: 63 y.o. male history of hypertension and alcoholism, previous stroke was brought to the ER after patient was found to have frequent falls. Patient lives with his friend in an apartment and is frequently checked by his sister. Patient's sister stated that patient was recently diagnosed with dementia and is largely dependent on others for his daily living activities. Over the last 1 week patient's friend noted that patient has been having frequent falls but did not lose consciousness. So patient was brought to the ER. In the ER CT of the head did not show anything acute and on exam patient had large right inguinal hernia which was not reducible and CT abdomen and pelvis showed features concerning for obstruction, on-call surgeon was consulted. patient noted to have hyponatremia on presentation with Na of 117.  Pt s/p inguinal hernia repair by Dr. Johna Sheriff 09/09/2014. Hospital course complicated by slow recovery, persistent hyponatremia, fall with acute rt subdural and left large intraparenchymal bleed with encephalopathy.   Assessment and Plan:   Principal Problem:  Fall with rt subdural and left large parenchymal bleed on 11/23 - d/w Dr. Conchita Paris and he explained that CT finding notable for intraparenchymal bleed of this type, especially since not causing mass effect, does not require surgical intervention (regardless of underlying medical conditions) and may spontaneously resolve over period of time, it is also not life threatening but can unfortunately become worse at which point surgical intervention would still not be indicated  - right subdural hematoma is actually smaller and Dr. Conchita Paris also explained it does not require an intervention  - pt is still  minimally responsive this AM, not coherent and has not been taking anything PO - d/w his brother in law, ? Proceed with comfort care as no response to current medical treatment noted - brother in law to discuss with family and will let me know what the decision is, still awaiting for response from family  - I explained that pt can not be d/c to SNF in this condition and asked them to consider residential hospice  - possible d/c in AM and will see with SW if any SNF will take pt  Hyponatremia - sodium stable at 124 but pt remains unresponsive difficult to arouse  - appreciate Dr. Elza Rafter assistance   Incarcerated inguinal hernia - status post repair with mesh, post op day #11 - continue to clean site with soap and water, no need for dressing, keep surgical area dry and clean - pt still not eating, grim prognosis   Fever - afebrile this AM 11/29 - CXR with no clear PNA noted, repeat UA unremarkable  - d/w family and consideration of [facility]  Active Problems:   Encephalopathy - Multifactorial, dementia, cerebral bleed, FTT - d/w family residential hospice, no further recommendations in terms of medical management from neurosurgery team - family to decide on d/c plan  - [facility] available if family in agreement   Hypokalemia - supplemented   Hypertension - stable. Prn hydralazine  Leukocytosis - WBC trending down   Hx of heavy Alcoholism -No signs of withdrawal  Severe PCM - no PO intake, do not think that tube feeds appropriate as per pt's known wishes to avoid life prolonging interventions   Acute functional quadriplegia - plan d/c ? Residential hospice  DVT prophylaxis  SCD's  Code Status: DNR Family Communication: Spoke with pt's brother over the phone  Disposition Plan: Remains inpatient   IV Access:    Peripheral IV Procedures and diagnostic studies:    Ct Head Wo Contrast 09/15/2014 Large hematoma right temporoparietal  lobe shows increased density compared to the prior study but is similar in size. This may indicate rebleeding in the interval. There is now blood in the right occipital horn which was not present previously. Right hemispheric subdural hematoma slightly smaller now measuring approximately 2 mm in thickness. No shift of the midline structures.   Ct Head Wo Contrast 09/14/2014 Examination is positive for acute right subdural hematoma and large right temporal lobe parenchymal hematoma. Medical Consultants:    Neurology  Nephrology   Neurosurgery   PCT Anti-Infectives:    Completed Rocephin  Debbora PrestoMAGICK-Majesta Leichter, MD  [facility] Pager [phone]  If 7PM-7AM, please contact night-coverage [email or web address] Password [email or web address] 09/20/2014, 2:44 PM   LOS: 12 days   HPI/Subjective: No events overnight.   Objective: Filed Vitals:   09/19/14 2115 09/20/14 0537 09/20/14 0609 09/20/14 1433  BP: 152/68 159/103  161/99  Pulse: 106 91  95  Temp: 98.6 F (37 C) 98.8 F (37.1 C)  98.4 F (36.9 C)  TempSrc: Axillary Oral  Oral  Resp: 22 18  18   Height:      Weight:   52.3 kg (115 lb 4.8 oz)   SpO2: 95% 98%  98%    Intake/Output Summary (Last 24 hours) at 09/20/14 1444 Last data filed at 09/20/14 0753  Gross per 24 hour  Intake 800.83 ml  Output    750 ml  Net  50.83 ml    Exam:   General:  Pt is e unresponsive   Cardiovascular: Regular rate and rhythm, no rubs, no gallops  Respiratory: Clear to auscultation bilaterally, no wheezing  Abdomen: Soft, non tender, non distended, bowel sounds present, no guarding  Data Reviewed: Basic Metabolic Panel:  Recent Labs Lab 09/18/14 1906  09/19/14 0140 09/19/14 0710 09/19/14 1326 09/19/14 1640 09/20/14 0543  NA 124*  < > 121* 124* 121* 123* 124*  K 4.9  < > 4.2 4.6 3.8 4.0 4.2  CL 97  < > 85* 87* 85* 87* 88*  CO2 14*  < > 21 21 21 20 23   GLUCOSE 52*  < > 82 79 184* 106* 104*  BUN 4*  < > 7 9 8 9 9   CREATININE 0.26*  < >  0.48* 0.52 0.46* 0.47* 0.49*  CALCIUM 5.4*  < > 8.6 8.8 8.4 8.6 8.5  PHOS 1.9*  --  3.2 3.6 2.9 3.0  --   < > = values in this interval not displayed. Liver Function Tests:  Recent Labs Lab 09/18/14 1906 09/19/14 0140 09/19/14 0710 09/19/14 1326 09/19/14 1640  ALBUMIN 1.7* 2.9* 2.8* 2.8* 2.8*   No results for input(s): LIPASE, AMYLASE in the last 168 hours. No results for input(s): AMMONIA in the last 168 hours. CBC:  Recent Labs Lab 09/17/14 1309 09/18/14 0148 09/18/14 1350 09/19/14 0140 09/20/14 0543  WBC 19.4* 14.5* 16.3* 18.3* 13.4*  HGB 11.1* 11.3* 11.2* 10.7* 10.9*  HCT 31.5* 32.0* 31.0* 30.1* 30.5*  MCV 86.5 85.1 85.6 85.0 85.2  PLT 413* 455* 458* 483* 490*   CBG:  Recent Labs Lab 09/19/14 1957 09/20/14 0011 09/20/14 0401 09/20/14 0717 09/20/14 1148  GLUCAP 95 101* 104* 97 104*    Recent Results (from the past 240 hour(s))  Urine culture     Status: None   Collection Time: 09/11/14  9:04 AM  Result Value Ref Range Status   Specimen Description URINE, RANDOM  Final   Special Requests NONE  Final   Culture  Setup Time   Final    09/11/2014 11:48 [facility]    Colony Count NO GROWTH [facility] at [facility]   Final   Culture NO GROWTH [facility] at [facility]   Final   Report Status 09/12/2014 FINAL  Final  Culture, Urine     Status: None   Collection Time: 09/12/14 10:46 AM  Result Value Ref Range Status   Specimen Description URINE, CATHETERIZED  Final   Special Requests NONE  Final   Culture  Setup Time   Final    09/12/2014 19:00 [facility]formed at [facility]    Colony Count NO GROWTH [facility] at [facility]   Final   Culture NO GROWTH [facility]formed at [facility]   Final   Report Status 09/13/2014 FINAL  Final  Urine culture     Status: None   Collection Time: 09/17/14 11:55 AM  Result Value Ref Range Status   Specimen Description URINE, RANDOM  Final   Special  Requests NONE  Final   Culture  Setup Time   Final    09/17/2014 14:38 [facility]formed at [facility]    Colony Count   Final    9,000 COLONIES/ML [facility]formed at [facility]    Culture   Final    INSIGNIFICANT GROWTH [facility]formed at [facility]    Report Status 09/18/2014 FINAL  Final     Scheduled Meds: . antiseptic oral rinse  7 mL Mouth Rinse q12n4p  . chlorhexidine  15 mL Mouth Rinse BID  . feeding supplement (ENSURE)  1 Container Oral TID BM  . folic acid  1 mg Oral Daily  . hydrALAZINE  10 mg Oral 3 times per day  . levETIRAcetam  1,000 mg Intravenous Q12H  . multivitamin with minerals  1 tablet Oral Daily  . nicotine  21 mg Transdermal Daily  . sodium chloride  3 mL Intravenous Q12H  . thiamine  100 mg Oral Daily   Or  . thiamine  100 mg Intravenous Daily   Continuous Infusions: . 0.9 % NaCl with KCl 40 mEq / L 20 mL/hr (09/18/14 2215)  . dextrose 5 % with kcl 50 mL/hr at 09/19/14 1359

## 2014-09-20 NOTE — Progress Notes (Signed)
[facility] Rounding Note Subjective:  No difference in level of responsiveness Primary is discussing level of care with family. Pt can't go to SNF if non-interactive Would be unable to feed or hydrate himself or participate in PT   Objective Vital signs in last 24 hours: Filed Vitals:   09/19/14 1806 09/19/14 2115 09/20/14 0537 09/20/14 0609  BP: 139/73 152/68 159/103   Pulse: 110 106 91   Temp: 99.1 F (37.3 C) 98.6 F (37 C) 98.8 F (37.1 C)   TempSrc: Oral Axillary Oral   Resp: 20 22 18    Height:      Weight:    52.3 kg (115 lb 4.8 oz)  SpO2: 98% 95% 98%    Weight change: 0.771 kg (1 lb 11.2 oz)  Intake/Output Summary (Last 24 hours) at 09/20/14 1142 Last data filed at 09/20/14 0753  Gross per 24 hour  Intake 800.83 ml  Output    750 ml  Net  50.83 ml   Physical Exam:  BP 159/103 mmHg  Pulse 91  Temp(Src) 98.8 F (37.1 C) (Oral)  Resp 18  Ht 5\' 6"  (1.676 m)  Wt 52.3 kg (115 lb 4.8 oz)  BMI 18.62 kg/m2  SpO2 98% Skin with reddened flaky patches Poorly responsive. No JVD Lungs grossly clear Condom cath - clear urine No edema    Recent Labs Lab 09/18/14 0756 09/18/14 1350 09/18/14 1906 09/18/14 2200 09/19/14 0140 09/19/14 0710 09/19/14 1326 09/19/14 1640 09/20/14 0543  NA 122* 118* 124* 120* 121* 124* 121* 123* 124*  K 4.6 4.3 4.9 4.5 4.2 4.6 3.8 4.0 4.2  CL 87* 83* 97 86* 85* 87* 85* 87* 88*  CO2 19 19 14* 19 21 21 21 20 23   GLUCOSE 77 195* 52* 82 82 79 184* 106* 104*  BUN 7 7 4* 6 7 9 8 9 9   CREATININE 0.46* 0.46* 0.26* 0.45* 0.48* 0.52 0.46* 0.47* 0.49*  CALCIUM 8.5 8.5 5.4* 8.4 8.6 8.8 8.4 8.6 8.5  PHOS 3.0 2.9 1.9*  --  3.2 3.6 2.9 3.0  --     Recent Labs Lab 09/19/14 0710 09/19/14 1326 09/19/14 1640  ALBUMIN 2.8* 2.8* 2.8*    Recent Labs Lab 09/18/14 0148 09/18/14 1350 09/19/14 0140 09/20/14 0543  WBC 14.5* 16.3* 18.3* 13.4*  HGB 11.3* 11.2* 10.7* 10.9*  HCT 32.0* 31.0* 30.1* 30.5*  MCV 85.1 85.6 85.0 85.2   PLT 455* 458* 483* 490*    Recent Labs Lab 09/19/14 1653 09/19/14 1957 09/20/14 0011 09/20/14 0401 09/20/14 0717  GLUCAP 105* 95 101* 104* 97   Studies/Results:  Dg Chest 1 View  09/19/2014   CLINICAL DATA:  Fever  EXAM: CHEST - 1 VIEW  COMPARISON:  09/11/2014  FINDINGS: Cardiomediastinal silhouette is stable. Mild hyperinflation again noted. Stable pleural parenchymal scarring in right apex. There is mild interstitial prominence bilaterally. Findings highly suspicious for pneumonitis or interstitial edema. No segmental infiltrate.  IMPRESSION: Stable pleural parenchymal scarring right apex. There is mild interstitial prominence bilaterally highly suspicious for pneumonitis or interstitial edema. No segmental infiltrate   Electronically Signed   By: Natasha MeadLiviu  Pop M.D.   On: 09/19/2014 15:38   Medications: . 0.9 % NaCl with KCl 40 mEq / L 20 mL/hr (09/18/14 2215)  . dextrose 5 % with kcl 50 mL/hr at 09/19/14 1359   . antiseptic oral rinse  7 mL Mouth Rinse q12n4p  . chlorhexidine  15 mL Mouth Rinse BID  . feeding supplement (ENSURE)  1 Container  Oral TID BM  . folic acid  1 mg Oral Daily  . hydrALAZINE  10 mg Oral 3 times per day  . levETIRAcetam  1,000 mg Intravenous Q12H  . multivitamin with minerals  1 tablet Oral Daily  . nicotine  21 mg Transdermal Daily  . sodium chloride  3 mL Intravenous Q12H  . thiamine  100 mg Oral Daily   Or  . thiamine  100 mg Intravenous Daily   Impression/Plan 1. Hyponatremia - Difficult situation. Some chronicity (sodium low 130's in 2009), improved some with NS administration earlier in the admission (see table in HPI of original consult note)) though not to normal. 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 In addition  volume depleted on exam at time of initial consultation and still may be a  little ont he dry side. UOSM of 479 fit with either vol depletion or SIADH or both. Sodium has levelled off around 122-124' . Close to euvolemic. No po intake at all.  Unclear to me what the plan is in terms of total comfort care vs being more aggressive.  Poor level of consciousness currently precludes use of salt tablets, or demeclocycline, and I don't think a vaptan is indicated, so would simply continue with the NS/KCl for now. I am not really adding anything to his care at this time. It would seem to me that he will have to be maintained on IVF until such time as he can eat/drink (if that occurs) at which time he could be treated with salt tablets/demeclocyline. His hyponatremia is stable at around 124 on normal saline + KCL. Decisions are being made about level of care. Renal will sign off at this time.  Please call if we can help further. 2. Acute SDH and large parenchymal bleed due to mechanical fall - not felt surgical candidate - neuro was following, made no additional recs 3. 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 HTN - getting prn meds IV 5. Chronic alcoholism 6. Malnutrition 7. DNR status   Camille Balynthia Oluwatosin Higginson, MD [facility] [phone] pager 09/20/2014, 11:42 AM

## 2014-09-21 ENCOUNTER — Inpatient Hospital Stay (HOSPITAL_COMMUNITY): Payer: Medicare Other

## 2014-09-21 LAB — GLUCOSE, CAPILLARY
GLUCOSE-CAPILLARY: 116 mg/dL — AB (ref 70–99)
GLUCOSE-CAPILLARY: 185 mg/dL — AB (ref 70–99)
GLUCOSE-CAPILLARY: 139 mg/dL — AB (ref 70–99)
Glucose-Capillary: 129 mg/dL — ABNORMAL HIGH (ref 70–99)

## 2014-09-21 MED ORDER — LORAZEPAM 1 MG PO TABS: 1.0000 mg | ORAL_TABLET | ORAL | Status: AC | PRN

## 2014-09-21 MED ORDER — MORPHINE SULFATE (CONCENTRATE) 10 MG/0.5ML PO SOLN: 5.0000 mg | ORAL | Status: AC | PRN

## 2014-09-21 MED ORDER — HYDRALAZINE HCL 20 MG/ML IJ SOLN
10.0000 mg | Freq: Once | INTRAMUSCULAR | Status: AC
  Administered 2014-09-21: 10 mg via INTRAVENOUS

## 2014-09-21 MED ORDER — LORAZEPAM 2 MG/ML IJ SOLN
1.0000 mg | Freq: Once | INTRAMUSCULAR | Status: AC
  Administered 2014-09-21: 1 mg via INTRAVENOUS

## 2014-09-21 MED ORDER — LORAZEPAM 2 MG/ML IJ SOLN
INTRAMUSCULAR | Status: AC
  Filled 2014-09-21: qty 1

## 2014-09-21 NOTE — Progress Notes (Signed)
SLP Cancellation Note  Patient Details Name: Mark [name] MRN: [id number] DOB: [birth year]   Cancelled treatment:       Reason Eval/Treat Not Completed: Medical issues which prohibited therapy. Medical decline, will sign off.    Ghadeer Kastelic, Riley NearingBonnie Caroline 09/21/2014, 8:34 AM

## 2014-09-21 NOTE — Progress Notes (Signed)
Occupational Therapy Discharge Patient Details Name: Mark BilberryMichael Gsell MRN: [id number] DOB: 09-Jun-1951 Today's Date: 09/21/2014 T   Patient discharged from OT services secondary to medical decline - will need to re-order OT to resume therapy services.  Please see latest therapy progress note for current level of functioning and progress toward goals.    Progress and discharge plan discussed with patient and/or caregiver:   GO     Alba CoryREDDING, Donatella Walski D 09/21/2014, 8:16 AM

## 2014-09-21 NOTE — Discharge Summary (Signed)
Physician Discharge Summary  Mark [name] [id number] DOB: 09-Jun-1951 DOA: 09/08/2014  PCP: Mark [name],Mark OLIVER, MD  Admit date: 09/08/2014 Discharge date: 09/21/2014  Recommendations for Outpatient Follow-up:  1. Pt will be discharged to [facility], family in agreement   Discharge Diagnoses:  Principal Problem:   Incarcerated inguinal hernia Active Problems:   Hyponatremia   Hypertension   Encephalopathy   Leucocytosis   Alcoholism   Protein-calorie malnutrition, severe   Acute subdural hematoma   ICH (intracerebral hemorrhage)   Palliative care encounter   Goals of care, counseling/discussion   Agitation   Intraparenchymal hematoma of brain  Discharge Condition: Guarded   Diet recommendation: comfort feeding as tolerated   Brief narrative: 63 y.o. male history of hypertension and alcoholism, previous stroke was brought to the ER after patient was found to have frequent falls. Patient lives with his friend in an apartment and is frequently checked by his sister. Patient's sister stated that patient was recently diagnosed with dementia and is largely dependent on others for his daily living activities. Over the last 1 week patient's friend noted that patient has been having frequent falls but did not lose consciousness. So patient was brought to the ER. In the ER CT of the head did not show anything acute and on exam patient had large right inguinal hernia which was not reducible and CT abdomen and pelvis showed features concerning for obstruction, on-call surgeon was consulted. patient noted to have hyponatremia on presentation with Na of 117.  Pt s/p inguinal hernia repair by Dr. Johna [name] 09/09/2014. Hospital course complicated by slow recovery, persistent hyponatremia, fall with acute rt subdural and left large intraparenchymal bleed with encephalopathy. No clinical response to medical therapy, pt remains unresponsive, no oral intake. Progressive decline and  deterioration worse over the night 11/29. More dyspnea AM 11/30 and requiring NRB. Plan to transition to full comfort care and [facility].  Assessment and Plan:   Principal Problem:  Fall with rt subdural and left large parenchymal bleed on 11/23 - d/w Dr. Conchita [name] and he explained that CT finding notable for intraparenchymal bleed of this type, especially since not causing mass effect, does not require surgical intervention (regardless of underlying medical conditions) and may spontaneously resolve over period of time, it is also not life threatening but can unfortunately become worse at which point surgical intervention would still not be indicated  - right subdural hematoma is actually smaller and Dr. Conchita [name] also explained it does not require an intervention  - pt is still minimally responsive this AM, not coherent and has not been taking anything PO - d/w his brother in law,proceed with comfort care as no response to current medical treatment noted - [facility]   Hyponatremia - sodium stable at 124 but pt remains unresponsive difficult to arouse  - appreciate Dr. Elza [name]'s assistance   Incarcerated inguinal hernia - status post repair with mesh, post op day #12 - continue to clean site with soap and water, no need for dressing, keep surgical area dry and clean - pt still not eating, remains   Fever - afebrile this AM 11/29 - CXR with no clear PNA noted, repeat UA unremarkable  - d/w family and consideration of [facility]  Active Problems:  Encephalopathy - Multifactorial, dementia, cerebral bleed, FTT - d/w family residential hospice, no further recommendations in terms of medical management from neurosurgery team - [facility] and full comfort as per pt's  wishes  - [facility] available  Hypokalemia - supplemented   Hypertension - stable.  Leukocytosis - WBC trending down  but no clinical response as noted above, remains unresponsive   Hx of heavy Alcoholism -No signs of withdrawal  Severe PCM - no PO intake, do not think that tube feeds appropriate as per pt's known wishes to avoid life prolonging interventions   Acute functional quadriplegia, remains bed bound  - plan d/c residential hospice   Code Status: DNR Family Communication: Spoke with pt's brother over the phone  Disposition Plan: D/C [facility]   IV Access:    Peripheral IV Procedures and diagnostic studies:    Ct Head Wo Contrast 09/15/2014 Large hematoma right temporoparietal lobe shows increased density compared to the prior study but is similar in size. This may indicate rebleeding in the interval. There is now blood in the right occipital horn which was not present previously. Right hemispheric subdural hematoma slightly smaller now measuring approximately 2 mm in thickness. No shift of the midline structures.   Ct Head Wo Contrast 09/14/2014 Examination is positive for acute right subdural hematoma and large right temporal lobe parenchymal hematoma. Medical Consultants:    Neurology  Nephrology   Neurosurgery   PCT Anti-Infectives:    Completed Rocephin  Discharge Exam: Filed Vitals:   09/21/14 0514  BP: 133/68  Pulse: 130  Temp: 98.3 F (36.8 C)  Resp: 32   Filed Vitals:   09/21/14 0321 09/21/14 0421 09/21/14 0514 09/21/14 0630  BP: 120/71 128/73 133/68   Pulse: 124 133 130   Temp:   98.3 F (36.8 C)   TempSrc:   Axillary   Resp: 32 28 32   Height:      Weight:    75.479 kg (166 lb 6.4 oz)  SpO2: 97% 99% 99%     General: Pt is unresponsive, NAD Cardiovascular: Regular rhythm, tachycardic, S1/S2 +, no murmurs, no rubs, no gallops Respiratory: Tachypnea with RR 28 bpm, very diminished breath sounds at bases  Abdominal: Soft, non tender, non distended, bowel sounds +, no guarding Extremities: no edema, no cyanosis, pulses  palpable bilaterally DP and PT  Discharge Instructions  Discharge Instructions    Diet - as tolerate  Complete by:  As directed      Increase activity slowly    Complete by:  As directed             Medication List    STOP taking these medications        lisinopril 10 MG tablet  Commonly known as:  PRINIVIL,ZESTRIL     lisinopril 20 MG tablet  Commonly known as:  PRINIVIL,ZESTRIL      TAKE these medications        LORazepam 1 MG tablet  Commonly known as:  ATIVAN  Place 1 tablet (1 mg total) under the tongue every 4 (four) hours as needed for anxiety.     morphine CONCENTRATE 10 MG/0.5ML Soln concentrated solution  Take 0.25-0.5 mLs (5-10 mg total) by mouth every 2 (two) hours as needed for moderate pain, severe pain or shortness of breath.           Follow-up Information    Follow up with Mark [name],Mark OLIVER, MD.   Specialty:  Family Medicine   Why:  As needed, If symptoms worsen   Contact information:   [address] [place] [id number] [phone]        The results of significant diagnostics from this hospitalization (  including imaging, microbiology, ancillary and laboratory) are listed below for reference.     Microbiology: Recent Results (from the past 240 hour(s))  Culture, Urine     Status: None   Collection Time: 09/12/14 10:46 AM  Result Value Ref Range Status   Specimen Description URINE, CATHETERIZED  Final   Special Requests NONE  Final   Culture  Setup Time   Final    09/12/2014 19:00 Performed at [facility]    Colony Count NO GROWTH Performed at [facility]   Final   Culture NO GROWTH Performed at [facility]   Final   Report Status 09/13/2014 FINAL  Final  Urine culture     Status: None   Collection Time: 09/17/14 11:55 AM  Result Value Ref Range Status   Specimen Description URINE, RANDOM  Final   Special Requests NONE  Final   Culture  Setup Time   Final    09/17/2014 14:38 Performed  at [facility] Count   Final    9,000 COLONIES/ML Performed at [facility]    Culture   Final    INSIGNIFICANT GROWTH Performed at [facility]    Report Status 09/18/2014 FINAL  Final     Labs: Basic Metabolic Panel:  Recent Labs Lab 09/18/14 1906  09/19/14 0140 09/19/14 0710 09/19/14 1326 09/19/14 1640 09/20/14 0543  NA 124*  < > 121* 124* 121* 123* 124*  K 4.9  < > 4.2 4.6 3.8 4.0 4.2  CL 97  < > 85* 87* 85* 87* 88*  CO2 14*  < > 21 21 21 20 23   GLUCOSE 52*  < > 82 79 184* 106* 104*  BUN 4*  < > 7 9 8 9 9   CREATININE 0.26*  < > 0.48* 0.52 0.46* 0.47* 0.49*  CALCIUM 5.4*  < > 8.6 8.8 8.4 8.6 8.5  PHOS 1.9*  --  3.2 3.6 2.9 3.0  --   < > = values in this interval not displayed.  Liver Function Tests:  Recent Labs Lab 09/18/14 1906 09/19/14 0140 09/19/14 0710 09/19/14 1326 09/19/14 1640  ALBUMIN 1.7* 2.9* 2.8* 2.8* 2.8*   CBC:  Recent Labs Lab 09/17/14 1309 09/18/14 0148 09/18/14 1350 09/19/14 0140 09/20/14 0543  WBC 19.4* 14.5* 16.3* 18.3* 13.4*  HGB 11.1* 11.3* 11.2* 10.7* 10.9*  HCT 31.5* 32.0* 31.0* 30.1* 30.5*  MCV 86.5 85.1 85.6 85.0 85.2  PLT 413* 455* 458* 483* 490*   CBG:  Recent Labs Lab 09/20/14 1625 09/20/14 2003 09/21/14 0138 09/21/14 0351 09/21/14 0731  GLUCAP 97 114* 116* 185* 139*   SIGNED: Time coordinating discharge: Over 30 minutes  [name], MD  [facility] 09/21/2014, 10:08 AM Pager [phone]  If 7PM-7AM, please contact night-coverage [email or web address] Password [email or web address]

## 2014-09-21 NOTE — Progress Notes (Signed)
[facility]. Pt to be transported by [facility].

## 2014-09-21 NOTE — Progress Notes (Signed)
Physical Therapy Discharge Patient Details Name: [name] MRN: [id number] DOB: Jun 13, 1951 Today's Date: 09/21/2014 Time:  - 747    Patient discharged from PT services secondary to medical decline - will need to re-order PT to resume therapy services.Chart reviewed, noted  Seizure activity,  On NRB. PT intervention not indicated at this time.  Please see latest therapy progress note for current level of functioning and progress toward goals.    Progress and discharge plan discussed with patient and/or caregiver: Patient unable to participate in discharge planning and no caregivers available  GP     Sharen HeckHill, Jaelie Aguilera Elizabeth Makaveli Hoard PT [phone]  09/21/2014, 7:47 AM

## 2014-09-21 NOTE — Clinical Social Work Note (Signed)
[facility] bed has been confirmed for patient today. Family has met with Erling Conte and is prepared for transfer. RN to call report and EMS to transport.  Eduard Clos, MSW, [place]

## 2014-09-21 NOTE — Plan of Care (Signed)
Problem: Phase I Progression Outcomes Goal: OOB as tolerated unless otherwise ordered Outcome: Not Progressing Goal: Initial discharge plan identified Outcome: Progressing  Problem: Phase II Progression Outcomes Goal: Foley discontinued Outcome: Completed/Met Date Met:  09/21/14 Goal: Tolerating diet Outcome: Not Progressing

## 2014-09-21 NOTE — Progress Notes (Signed)
Pt noted to have approx 4 apparent seizures lasting 20-30 each in last 6 hours.  Lenny Pastelom Callahan notified and orders received.  Pt's sister and brother notified of pt's decline. Vitals taken frequently and recorded.  resptherapy  notified and pt placed on non-rebreather mask w/ HOB elevated.  Pt very diaphoretic.

## 2014-09-21 NOTE — Clinical Social Work Note (Signed)
Patient discussed in morning rounds with MD- patient is declining and per MD, [facility]. CSW spoke with Forrestine HimEva Davis, [facility] who plans to follow up with family for possible bed and transfer- Will await further word regarding [facility] and advise.  Reece LevyJanet Platon Arocho, MSW, Theresia MajorsLCSWA [phone]

## 2014-09-21 NOTE — Consult Note (Signed)
[facility] Liaison: Received request from CSW for [facility]. Chart reviewed, note changes over weekend. [facility]. Spoke with Romeo AppleBen and Darel HongJudy by phone. Plan to meet them at 1100 to complete paper work. MD aware. Will need DC summary faxed to [phone]. RN please call report to [phone]. Please arrange transport for patient to arrive as early in the day as possible. Thank you. Forrestine Himva Rogena Deupree LCSW [phone]

## 2014-10-23 DEATH — deceased

## 2016-09-23 IMAGING — CT CT HEAD W/O CM
1 of 2 series · 15 of 30 positions shown, 19 images · non-contrast
Comparison: CT head 09/14/2014

CLINICAL DATA: Followup subdural hematoma.

EXAM:
CT HEAD WITHOUT CONTRAST
TECHNIQUE: Contiguous axial images were obtained from the base of the skull
through the vertex without intravenous contrast.

[Series 3: head_spiral 5.0 h37s st · axial · 0.45mm/px · z∈[-140,-5]mm · 15 of 31 slices shown, 19 images]
[im 2/31  brain]
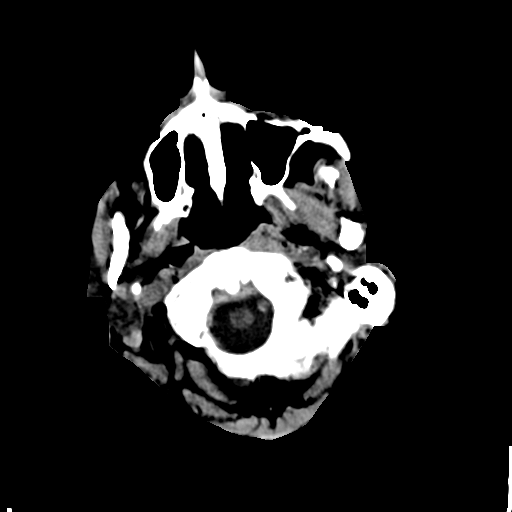
[im 2/31  bone]
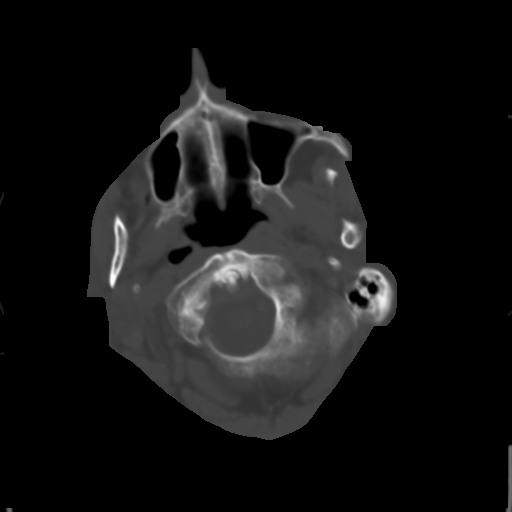
[im 4/31  brain]
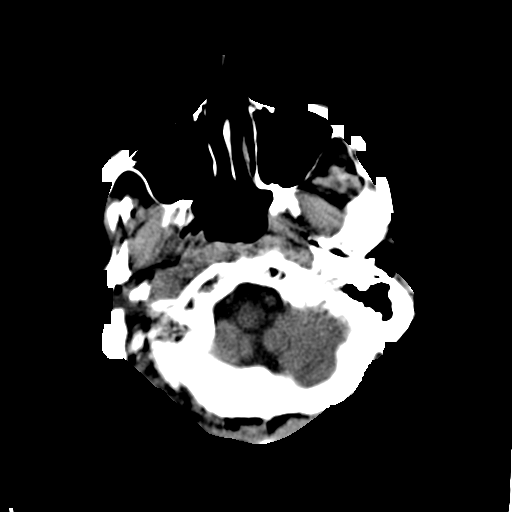
[im 6/31  brain]
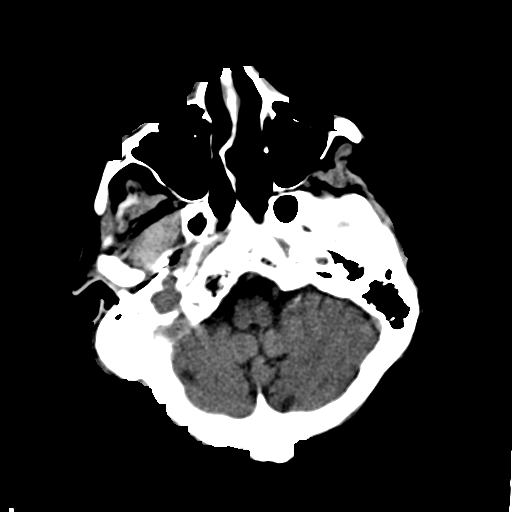
[im 8/31  brain]
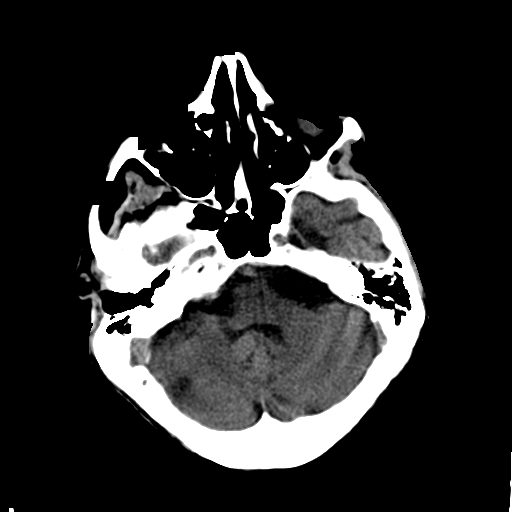
[im 10/31  brain]
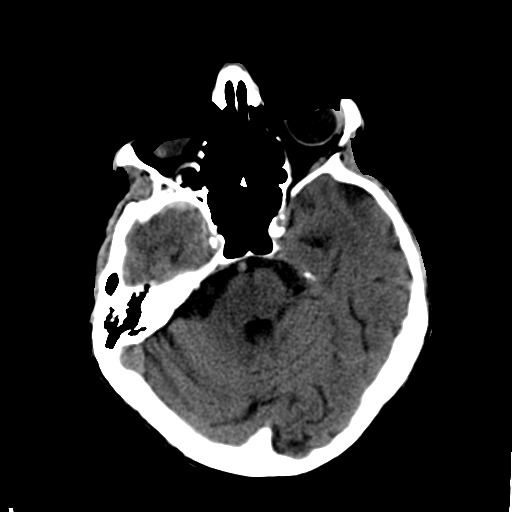
[im 10/31  bone]
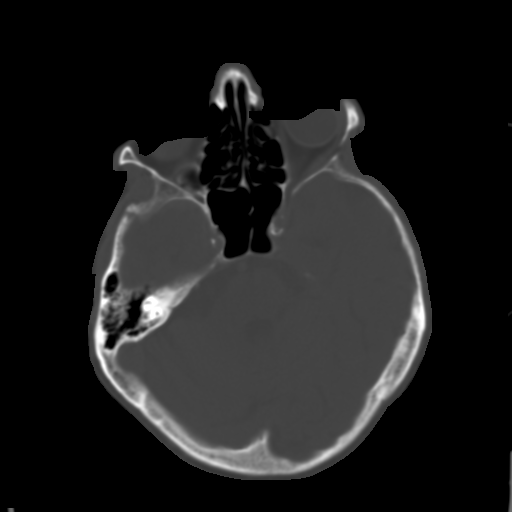
[im 12/31  brain]
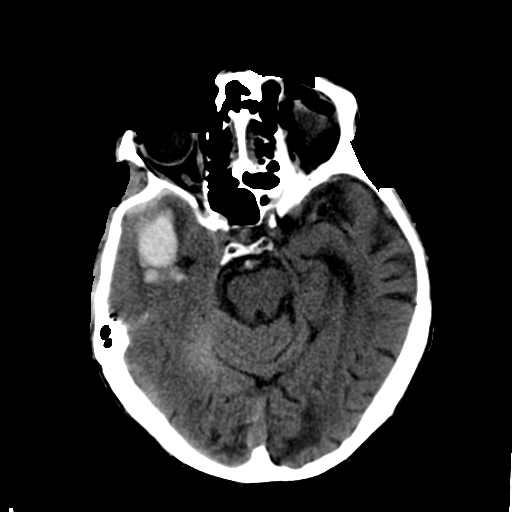
[im 14/31  brain]
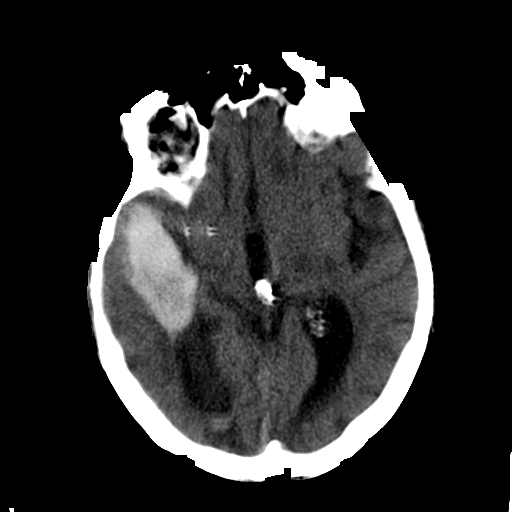
[im 16/31  brain]
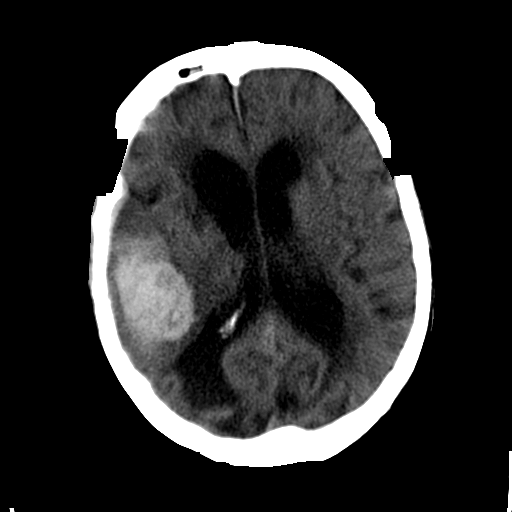
[im 17/31  brain]
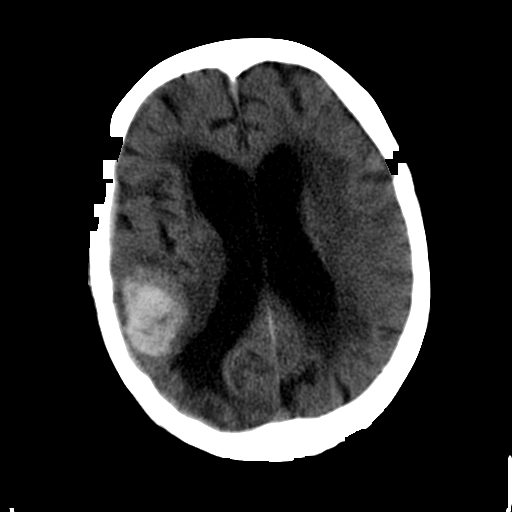
[im 17/31  bone]
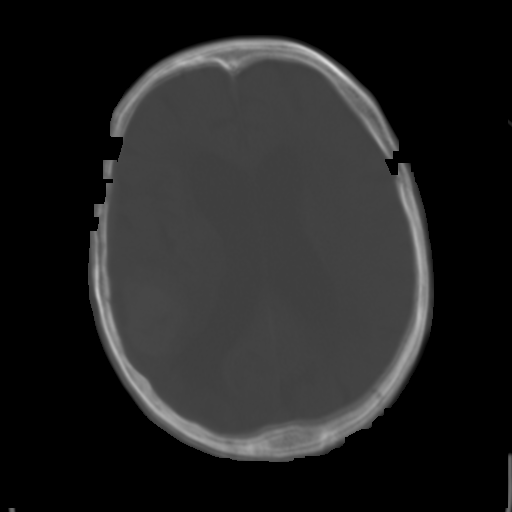
[im 19/31  brain]
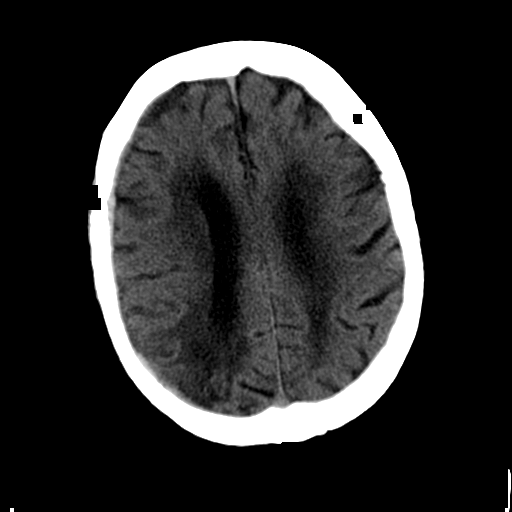
[im 21/31  brain]
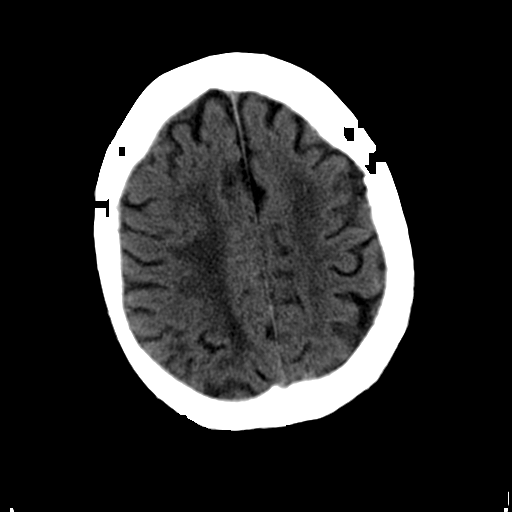
[im 23/31  brain]
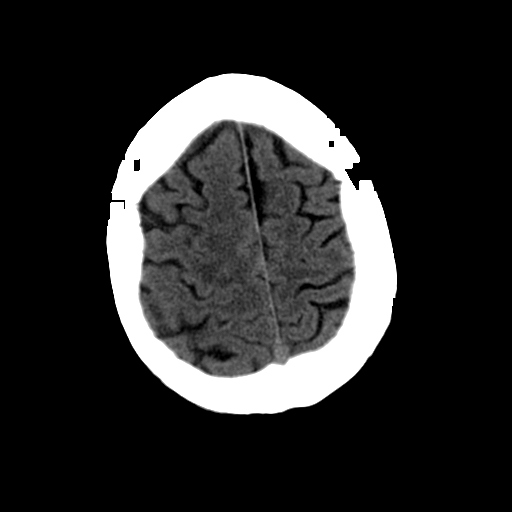
[im 25/31  brain]
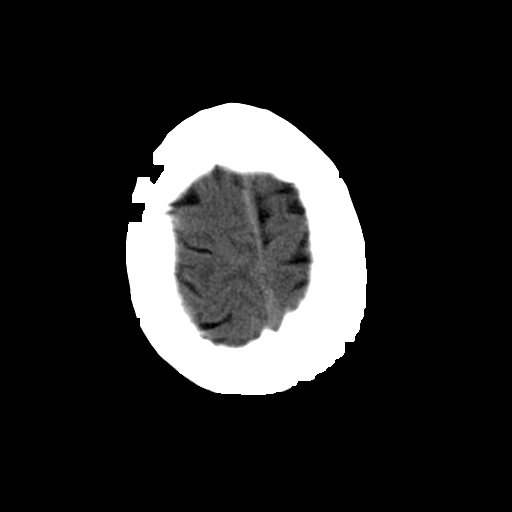
[im 25/31  bone]
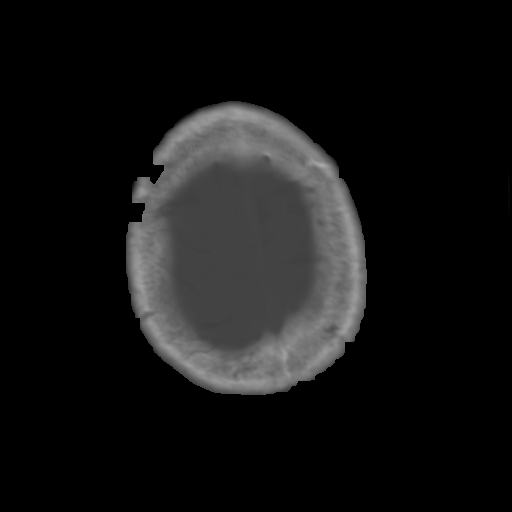
[im 27/31  brain]
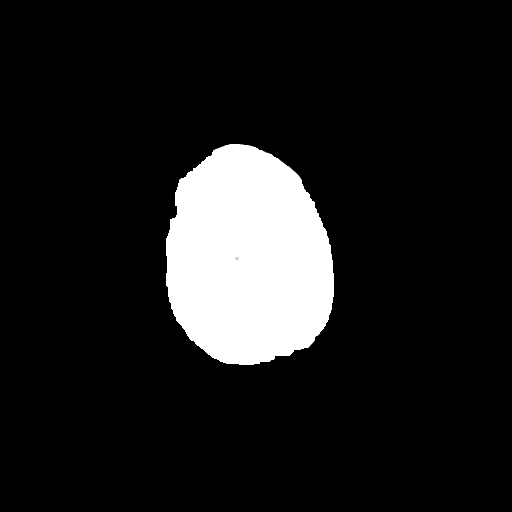
[im 29/31  brain]
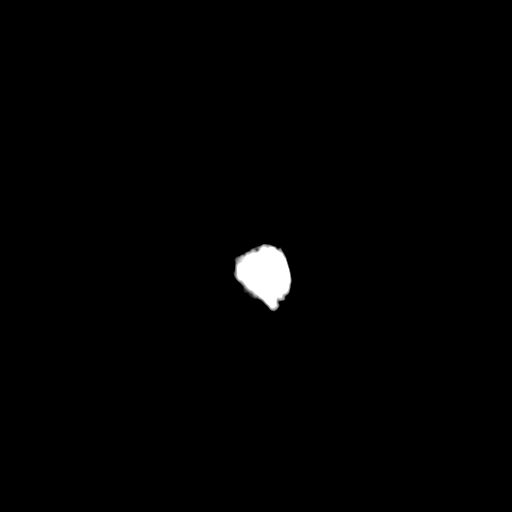

[15 of 30 positions shown; findings below may reference images not displayed]

FINDINGS: Image quality degraded by significant motion. The entire study was
repeated due to motion.

Large high-density acute hematoma in the right temporoparietal lobe
measures approximately 6 x 3.3 cm. This is similar in size however
shows increased density compared with the prior study suggesting
interval rebleeding. In addition there is now blood layering in the
occipital horns which was not present previously.

Thin high density subdural hematoma over the right temporal parietal
lobe slightly smaller compared with the prior study.

Diffuse atrophy. Ventricular enlargement is stable and consistent
with atrophy.

Advanced chronic microvascular ischemic change. Chronic infarct left
thalamus and right cerebellum. Chronic occipital pole infarcts
bilaterally. No acute ischemic infarct. Negative for mass lesion.
IMPRESSION: Large hematoma right temporoparietal lobe shows increased density
compared to the prior study but is similar in size. This may
indicate rebleeding in the interval. There is now blood in the right
occipital horn which was not present previously.

Right hemispheric subdural hematoma slightly smaller now measuring
approximately 2 mm in thickness. No shift of the midline structures.

These results will be called to the ordering clinician or
representative by the Radiologist Assistant, and communication
documented in the PACS or zVision Dashboard.

## 2016-09-24 IMAGING — CT CT HEAD W/O CM
1 series · 16 of 30 positions shown, 20 images · non-contrast
Comparison: CT 09/15/2014

CLINICAL DATA: Intracranial hemorrhage.

EXAM:
CT HEAD WITHOUT CONTRAST
TECHNIQUE: Contiguous axial images were obtained from the base of the skull
through the vertex without intravenous contrast.

[Series 2: head_spiral 5.0 h37s st · axial · 0.45mm/px · z∈[-160,-20]mm · 16 of 32 slices shown, 20 images]
[im 2/32  brain]
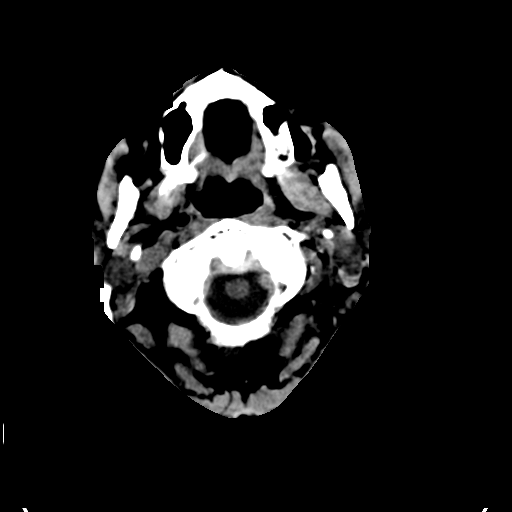
[im 2/32  bone]
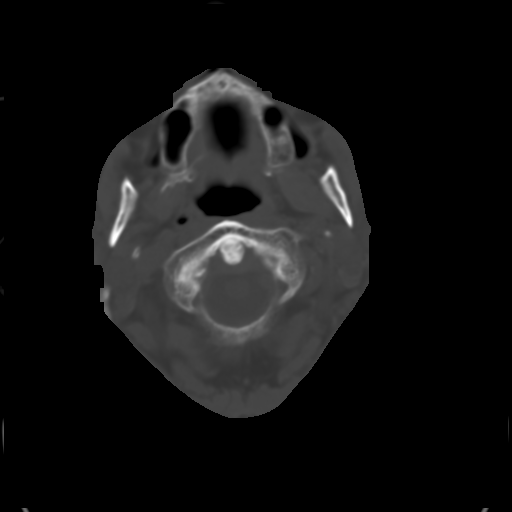
[im 4/32  brain]
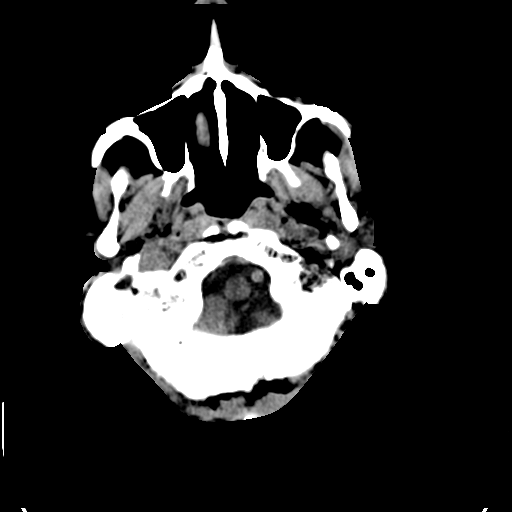
[im 6/32  brain]
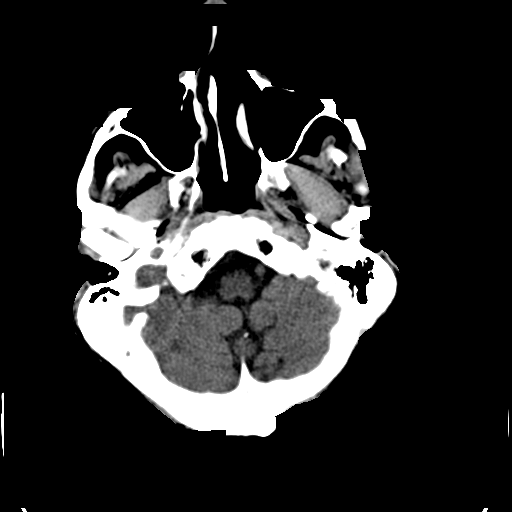
[im 8/32  brain]
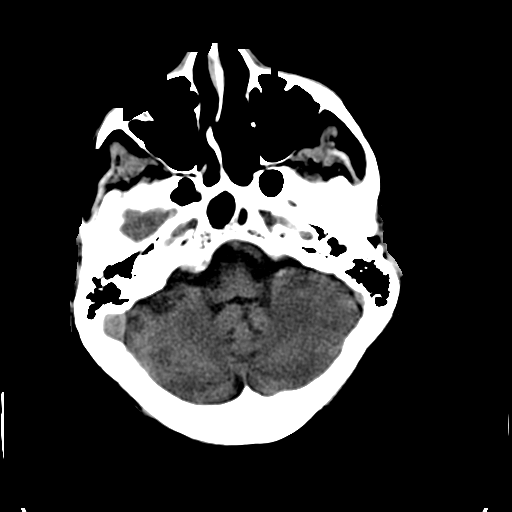
[im 9/32  brain]
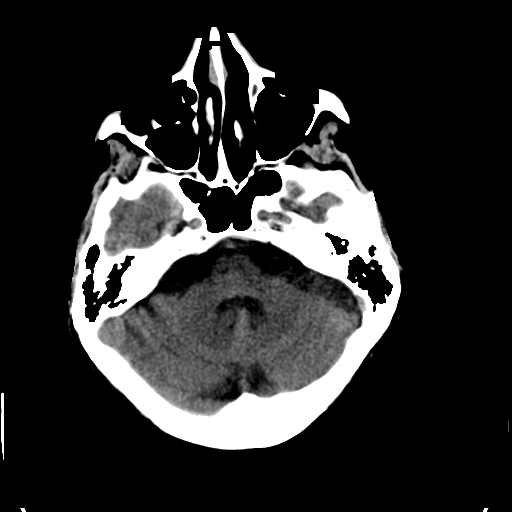
[im 9/32  bone]
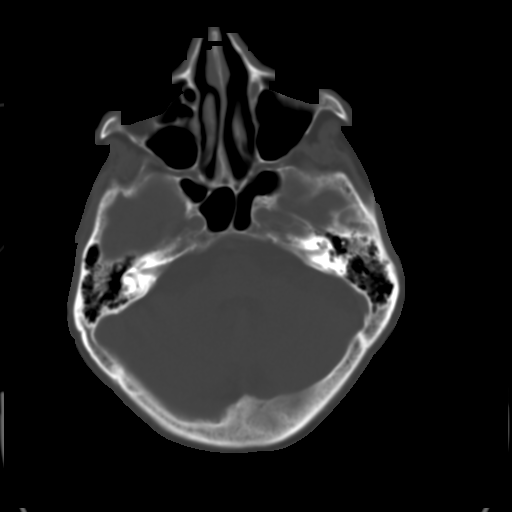
[im 11/32  brain]
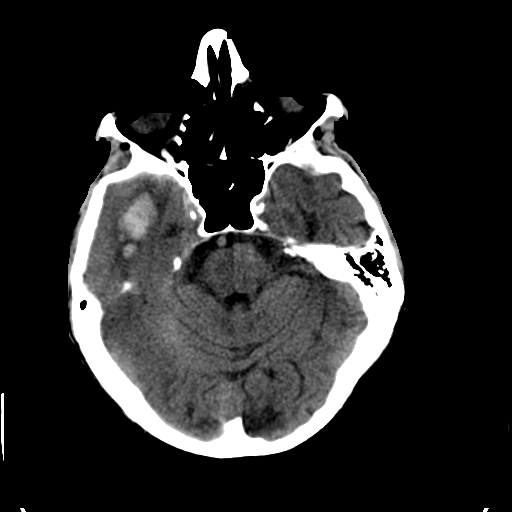
[im 13/32  brain]
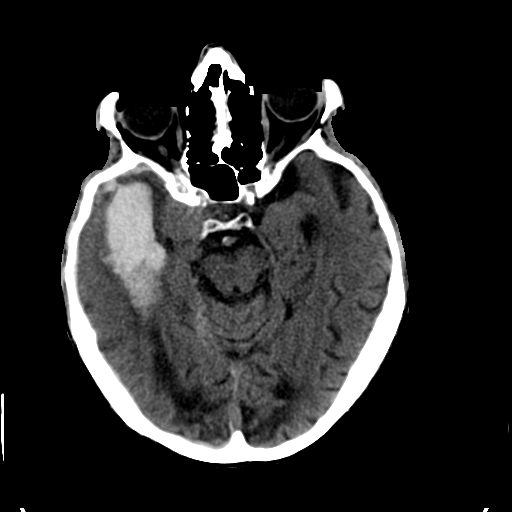
[im 15/32  brain]
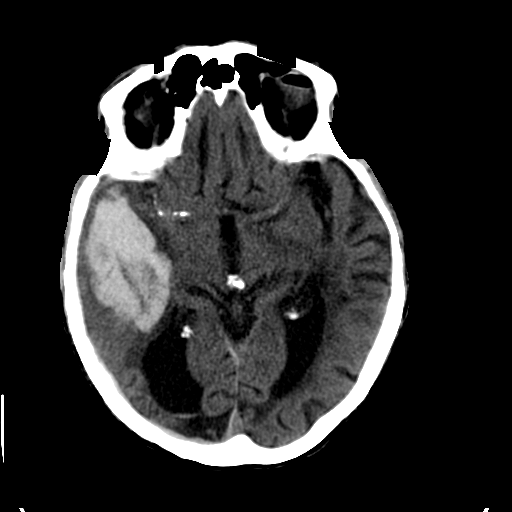
[im 17/32  brain]
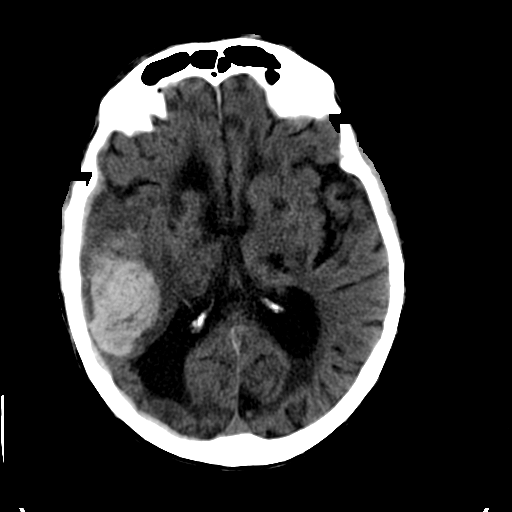
[im 17/32  bone]
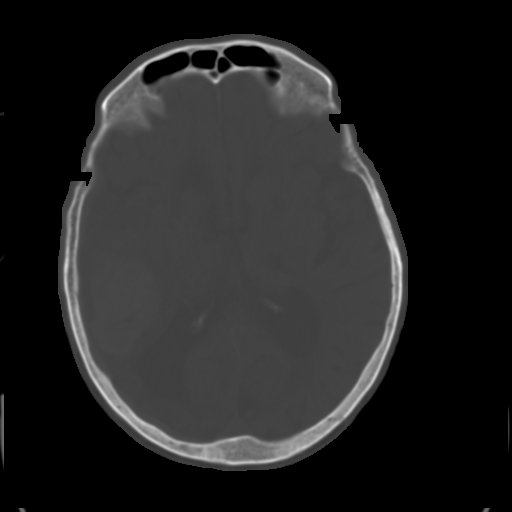
[im 19/32  brain]
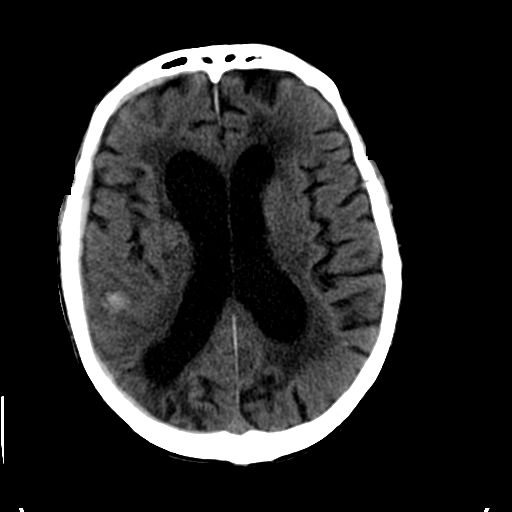
[im 21/32  brain]
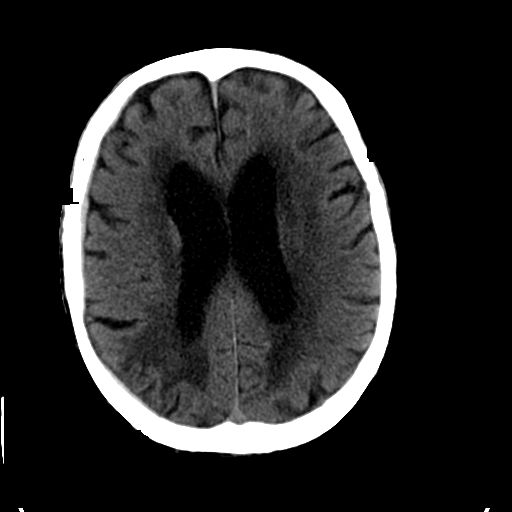
[im 23/32  brain]
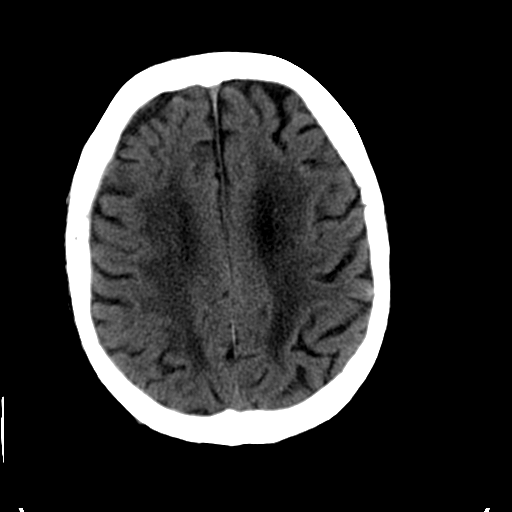
[im 24/32  brain]
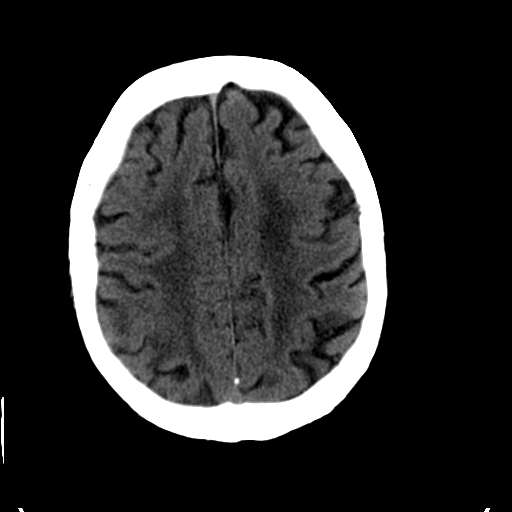
[im 24/32  bone]
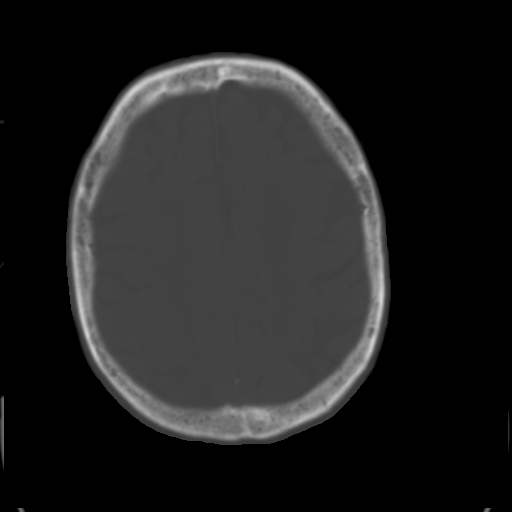
[im 26/32  brain]
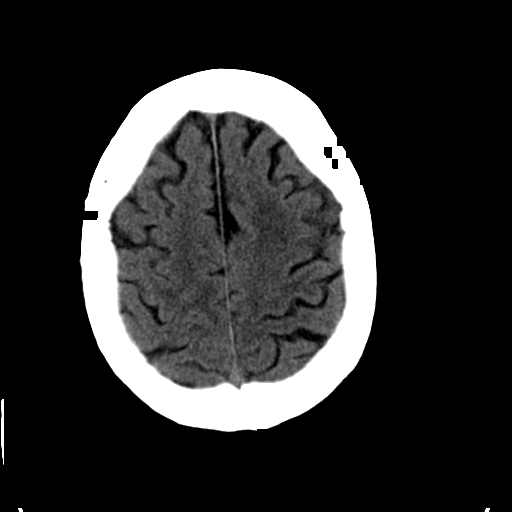
[im 28/32  brain]
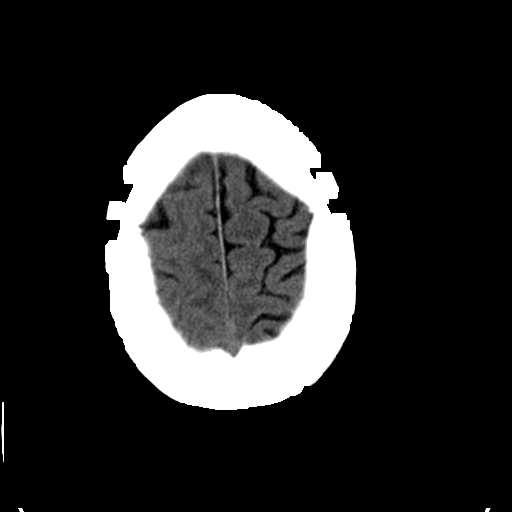
[im 30/32  brain]
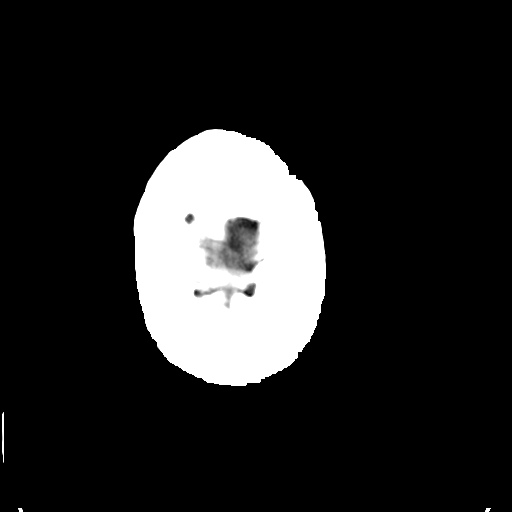

[16 of 30 positions shown; findings below may reference images not displayed]

FINDINGS: Right temporal lobe hematoma appears unchanged and measures
approximately 6.5 x 3.0 cm. Small right temporal and right parietal
subdural hematoma unchanged. No new hemorrhage. No shift of the
midline structures.

Mild intraventricular hemorrhage is unchanged in the occipital horn
bilaterally.

Generalized atrophy of a moderate degree. Extensive chronic ischemic
changes are stable.
IMPRESSION: No change from yesterday.  No new hemorrhage.
